# Patient Record
Sex: Male | Born: 1960 | Race: White | Hispanic: No | Marital: Single | Smoking: Never smoker
Health system: Southern US, Community
[De-identification: ages and names within clinical notes are randomized; demographics above are authoritative.]

## PROBLEM LIST (undated history)

## (undated) DIAGNOSIS — M1990 Unspecified osteoarthritis, unspecified site: Secondary | ICD-10-CM

## (undated) DIAGNOSIS — K219 Gastro-esophageal reflux disease without esophagitis: Secondary | ICD-10-CM

## (undated) DIAGNOSIS — J45909 Unspecified asthma, uncomplicated: Secondary | ICD-10-CM

## (undated) DIAGNOSIS — E785 Hyperlipidemia, unspecified: Secondary | ICD-10-CM

## (undated) DIAGNOSIS — I10 Essential (primary) hypertension: Secondary | ICD-10-CM

## (undated) HISTORY — PX: KNEE GANGLION EXCISION: SHX691

## (undated) HISTORY — PX: CYST REMOVAL LEG: SHX6280

## (undated) HISTORY — PX: JOINT REPLACEMENT: SHX530

## (undated) HISTORY — PX: INCISION AND DRAINAGE ABSCESS: SHX5864

## (undated) HISTORY — DX: Hyperlipidemia, unspecified: E78.5

---

## 2011-03-23 ENCOUNTER — Emergency Department: Payer: Self-pay | Admitting: Emergency Medicine

## 2018-01-06 ENCOUNTER — Telehealth: Payer: Self-pay

## 2018-01-06 ENCOUNTER — Other Ambulatory Visit: Payer: Self-pay

## 2018-01-06 DIAGNOSIS — Z1211 Encounter for screening for malignant neoplasm of colon: Secondary | ICD-10-CM

## 2018-01-06 NOTE — Telephone Encounter (Signed)
Gastroenterology Pre-Procedure Review  Request Date: 02/19/18 Requesting Physician: Dr. Allen Norris  PATIENT REVIEW QUESTIONS: The patient responded to the following health history questions as indicated:    1. Are you having any GI issues? no 2. Do you have a personal history of Polyps? no 3. Do you have a family history of Colon Cancer or Polyps? yes (Family history of polyps) 4. Diabetes Mellitus? no 5. Joint replacements in the past 12 months?no 6. Major health problems in the past 3 months?no 7. Any artificial heart valves, MVP, or defibrillator?no    MEDICATIONS & ALLERGIES:    Patient reports the following regarding taking any anticoagulation/antiplatelet therapy:   Plavix, Coumadin, Eliquis, Xarelto, Lovenox, Pradaxa, Brilinta, or Effient? no Aspirin? yes (81 mg)  Patient confirms/reports the following medications:  No current outpatient medications on file.   No current facility-administered medications for this visit.     Patient confirms/reports the following allergies:  Allergies not on file  No orders of the defined types were placed in this encounter.   AUTHORIZATION INFORMATION Primary Insurance: 1D#: Group #:  Secondary Insurance: 1D#: Group #:  SCHEDULE INFORMATION: Date: 02/19/18  Time: Location:[facility]

## 2018-02-15 NOTE — Discharge Instructions (Signed)
General Anesthesia, Adult, Care After °These instructions provide you with information about caring for yourself after your procedure. Your health care provider may also give you more specific instructions. Your treatment has been planned according to current medical practices, but problems sometimes occur. Call your health care provider if you have any problems or questions after your procedure. °What can I expect after the procedure? °After the procedure, it is common to have: °· Vomiting. °· A sore throat. °· Mental slowness. ° °It is common to feel: °· Nauseous. °· Cold or shivery. °· Sleepy. °· Tired. °· Sore or achy, even in parts of your body where you did not have surgery. ° °Follow these instructions at home: °For at least 24 hours after the procedure: °· Do not: °? Participate in activities where you could fall or become injured. °? Drive. °? Use heavy machinery. °? Drink alcohol. °? Take sleeping pills or medicines that cause drowsiness. °? Make important decisions or sign legal documents. °? Take care of children on your own. °· Rest. °Eating and drinking °· If you vomit, drink water, juice, or soup when you can drink without vomiting. °· Drink enough fluid to keep your urine clear or pale yellow. °· Make sure you have little or no nausea before eating solid foods. °· Follow the diet recommended by your health care provider. °General instructions °· Have a responsible adult stay with you until you are awake and alert. °· Return to your normal activities as told by your health care provider. Ask your health care provider what activities are safe for you. °· Take over-the-counter and prescription medicines only as told by your health care provider. °· If you smoke, do not smoke without supervision. °· Keep all follow-up visits as told by your health care provider. This is important. °Contact a health care provider if: °· You continue to have nausea or vomiting at home, and medicines are not helpful. °· You  cannot drink fluids or start eating again. °· You cannot urinate after 8-12 hours. °· You develop a skin rash. °· You have fever. °· You have increasing redness at the site of your procedure. °Get help right away if: °· You have difficulty breathing. °· You have chest pain. °· You have unexpected bleeding. °· You feel that you are having a life-threatening or urgent problem. °This information is not intended to replace advice given to you by your health care provider. Make sure you discuss any questions you have with your health care provider. °Document Released: 03/23/2001 Document Revised: 05/19/2016 Document Reviewed: 11/29/2015 °Elsevier Interactive Patient Education © 2018 [employer] ° °

## 2018-02-19 ENCOUNTER — Ambulatory Visit: Payer: Self-pay | Admitting: Anesthesiology

## 2018-02-19 ENCOUNTER — Ambulatory Visit
Admission: RE | Admit: 2018-02-19 | Discharge: 2018-02-19 | Disposition: A | Discharge status: Home / Self Care | Payer: Self-pay | Source: Ambulatory Visit | Attending: Gastroenterology | Admitting: Gastroenterology

## 2018-02-19 ENCOUNTER — Encounter
Admission: RE | Disposition: A | Discharge status: Home / Self Care | Payer: Self-pay | Source: Ambulatory Visit | Attending: Gastroenterology

## 2018-02-19 DIAGNOSIS — Z1211 Encounter for screening for malignant neoplasm of colon: Secondary | ICD-10-CM

## 2018-02-19 DIAGNOSIS — Z791 Long term (current) use of non-steroidal anti-inflammatories (NSAID): Secondary | ICD-10-CM | POA: Insufficient documentation

## 2018-02-19 DIAGNOSIS — K5730 Diverticulosis of large intestine without perforation or abscess without bleeding: Secondary | ICD-10-CM | POA: Insufficient documentation

## 2018-02-19 DIAGNOSIS — K640 First degree hemorrhoids: Secondary | ICD-10-CM | POA: Insufficient documentation

## 2018-02-19 DIAGNOSIS — D125 Benign neoplasm of sigmoid colon: Secondary | ICD-10-CM

## 2018-02-19 DIAGNOSIS — Z7982 Long term (current) use of aspirin: Secondary | ICD-10-CM | POA: Insufficient documentation

## 2018-02-19 DIAGNOSIS — D122 Benign neoplasm of ascending colon: Secondary | ICD-10-CM

## 2018-02-19 DIAGNOSIS — K635 Polyp of colon: Secondary | ICD-10-CM

## 2018-02-19 DIAGNOSIS — D124 Benign neoplasm of descending colon: Secondary | ICD-10-CM | POA: Insufficient documentation

## 2018-02-19 DIAGNOSIS — Z79899 Other long term (current) drug therapy: Secondary | ICD-10-CM | POA: Insufficient documentation

## 2018-02-19 HISTORY — DX: Unspecified osteoarthritis, unspecified site: M19.90

## 2018-02-19 HISTORY — PX: POLYPECTOMY: SHX5525

## 2018-02-19 HISTORY — PX: COLONOSCOPY WITH PROPOFOL: SHX5780

## 2018-02-19 HISTORY — DX: Unspecified asthma, uncomplicated: J45.909

## 2018-02-19 SURGERY — COLONOSCOPY WITH PROPOFOL
Anesthesia: General | Site: Rectum | Wound class: Contaminated

## 2018-02-19 MED ORDER — PROPOFOL 10 MG/ML IV BOLUS
INTRAVENOUS | Status: DC | PRN
  Administered 2018-02-19: 20 mg via INTRAVENOUS
  Administered 2018-02-19: 30 mg via INTRAVENOUS
  Administered 2018-02-19: 20 mg via INTRAVENOUS
  Administered 2018-02-19: 30 mg via INTRAVENOUS
  Administered 2018-02-19: 20 mg via INTRAVENOUS
  Administered 2018-02-19: 100 mg via INTRAVENOUS
  Administered 2018-02-19 (×2): 50 mg via INTRAVENOUS

## 2018-02-19 MED ORDER — LIDOCAINE HCL (CARDIAC) 20 MG/ML IV SOLN
INTRAVENOUS | Status: DC | PRN
  Administered 2018-02-19: 50 mg via INTRAVENOUS

## 2018-02-19 MED ORDER — STERILE WATER FOR IRRIGATION IR SOLN
Status: DC | PRN
  Administered 2018-02-19: .5 mL

## 2018-02-19 MED ORDER — LACTATED RINGERS IV SOLN
10.0000 mL/h | INTRAVENOUS | Status: DC
  Administered 2018-02-19: 12:00:00 via INTRAVENOUS

## 2018-02-19 SURGICAL SUPPLY — 12 items
CANISTER SUCT 1200ML W/VALVE (MISCELLANEOUS) ×4 IMPLANT
CLIP HMST 235XBRD CATH ROT (MISCELLANEOUS) IMPLANT
CLIP RESOLUTION 360 11X235 (MISCELLANEOUS)
ELECT REM PT RETURN 9FT ADLT (ELECTROSURGICAL)
ELECTRODE REM PT RTRN 9FT ADLT (ELECTROSURGICAL) IMPLANT
FORCEPS BIOP RAD 4 LRG CAP 4 (CUTTING FORCEPS) ×4 IMPLANT
GOWN CVR UNV OPN BCK APRN NK (MISCELLANEOUS) ×4 IMPLANT
GOWN ISOL THUMB LOOP REG UNIV (MISCELLANEOUS) ×4
KIT ENDO PROCEDURE OLY (KITS) ×4 IMPLANT
SNARE SHORT THROW 13M SML OVAL (MISCELLANEOUS) IMPLANT
TRAP ETRAP POLY (MISCELLANEOUS) IMPLANT
WATER STERILE IRR 250ML POUR (IV SOLUTION) ×4 IMPLANT

## 2018-02-19 NOTE — H&P (Signed)
   Lucilla Lame, MD [address] [place], [place] [address] Phone: [phone] Fax : [phone]  Primary Care Physician:  Cletis Athens, MD Primary Gastroenterologist:  Dr. Allen Norris  Pre-Procedure History & Physical: HPI:  Nathaniel Roberts is a 57 y.o. male is here for a screening colonoscopy.   Past Medical History:  Diagnosis Date  . Arthritis    ankles  . Asthma     History reviewed. No pertinent surgical history.  Prior to Admission medications   Medication Sig Start Date End Date Taking? Authorizing Provider  aspirin 81 MG chewable tablet Chew by mouth daily.   Yes [provider]  Docusate Sodium (COLACE PO) Take by mouth daily.   Yes [provider]  Garlic 3614 MG CAPS Take by mouth daily.   Yes [provider]  loratadine (CLARITIN) 10 MG tablet Take 10 mg by mouth daily.   Yes [provider]  Multiple Vitamin (MULTIVITAMIN) tablet Take 1 tablet by mouth daily.   Yes [provider]  naproxen sodium (ALEVE) 220 MG tablet Take 220 mg by mouth daily as needed.   Yes [provider]  Omega-3 Fatty Acids (FISH OIL) 1000 MG CAPS Take by mouth daily.   Yes [provider]    Allergies as of 01/06/2018  . (Not on File)    History reviewed. No pertinent family history.  Social History   Socioeconomic History  . Marital status: Married    Spouse name: Not on file  . Number of children: Not on file  . Years of education: Not on file  . Highest education level: Not on file  Social Needs  . Financial resource strain: Not on file  . Food insecurity - worry: Not on file  . Food insecurity - inability: Not on file  . Transportation needs - medical: Not on file  . Transportation needs - non-medical: Not on file  Occupational History  . Not on file  Tobacco Use  . Smoking status: Never Smoker  . Smokeless tobacco: Never Used  Substance and Sexual Activity  . Alcohol use: Yes    Alcohol/week:  7.2 oz    Types: 12 Glasses of wine per week  . Drug use: Not on file  . Sexual activity: Not on file  Other Topics Concern  . Not on file  Social History Narrative  . Not on file    Review of Systems: See HPI, otherwise negative ROS  Physical Exam: BP (!) 136/94   Pulse 74   Temp 98.1 F (36.7 C) (Temporal)   Ht 5' 6.5" (1.689 m)   Wt 198 lb (89.8 kg)   SpO2 98%   BMI 31.48 kg/m  General:   Alert,  pleasant and cooperative in NAD Head:  Normocephalic and atraumatic. Neck:  Supple; no masses or thyromegaly. Lungs:  Clear throughout to auscultation.    Heart:  Regular rate and rhythm. Abdomen:  Soft, nontender and nondistended. Normal bowel sounds, without guarding, and without rebound.   Neurologic:  Alert and  oriented x4;  grossly normal neurologically.  Impression/Plan: Nathaniel Roberts is now here to undergo a screening colonoscopy.  Risks, benefits, and alternatives regarding colonoscopy have been reviewed with the patient.  Questions have been answered.  All parties agreeable.

## 2018-02-19 NOTE — Anesthesia Preprocedure Evaluation (Signed)
Anesthesia Evaluation  Patient identified by MRN, date of birth, ID band Patient awake    Reviewed: Allergy & Precautions, H&P , NPO status , Patient's Chart, lab work & pertinent test results  Airway Mallampati: II  TM Distance: >3 FB Neck ROM: full    Dental no notable dental hx.    Pulmonary asthma ,    Pulmonary exam normal breath sounds clear to auscultation       Cardiovascular Normal cardiovascular exam Rhythm:regular Rate:Normal     Neuro/Psych    GI/Hepatic   Endo/Other    Renal/GU      Musculoskeletal   Abdominal   Peds  Hematology   Anesthesia Other Findings   Reproductive/Obstetrics                             Anesthesia Physical Anesthesia Plan  ASA: II  Anesthesia Plan: General   Post-op Pain Management:    Induction:   PONV Risk Score and Plan: 2 and Treatment may vary due to age or medical condition and Propofol infusion  Airway Management Planned:   Additional Equipment:   Intra-op Plan:   Post-operative Plan:   Informed Consent: I have reviewed the patients History and Physical, chart, labs and discussed the procedure including the risks, benefits and alternatives for the proposed anesthesia with the patient or authorized representative who has indicated his/her understanding and acceptance.     Plan Discussed with: CRNA  Anesthesia Plan Comments:         Anesthesia Quick Evaluation

## 2018-02-19 NOTE — Op Note (Signed)
[facility] Gastroenterology Patient Name: Nathaniel Roberts Procedure Date: 02/19/2018 11:58 AM MRN: [id number] Account #: [id number] Date of Birth: 19-Jan-1961 Admit Type: Outpatient Age: 57 Room: [facility] OR ROOM 01 Gender: Male Note Status: Finalized Procedure:            Colonoscopy Indications:          Screening for colorectal malignant neoplasm Providers:            Lucilla Lame MD, MD Referring MD:         Cletis Athens, MD (Referring MD) Medicines:            Propofol per Anesthesia Complications:        No immediate complications. Procedure:            Pre-Anesthesia Assessment:                       - Prior to the procedure, a History and Physical was                        performed, and patient medications and allergies were                        reviewed. The patient's tolerance of previous                        anesthesia was also reviewed. The risks and benefits of                        the procedure and the sedation options and risks were                        discussed with the patient. All questions were                        answered, and informed consent was obtained. Prior                        Anticoagulants: The patient has taken no previous                        anticoagulant or antiplatelet agents. ASA Grade                        Assessment: II - A patient with mild systemic disease.                        After reviewing the risks and benefits, the patient was                        deemed in satisfactory condition to undergo the                        procedure.                       After obtaining informed consent, the colonoscope was                        passed under direct vision. Throughout the procedure,  the patient's blood pressure, pulse, and oxygen                        saturations were monitored continuously. The Olympus CF                        H180AL Colonoscope (S#: [id number]) was introduced through                         the anus and advanced to the the cecum, identified by                        appendiceal orifice and ileocecal valve. The                        colonoscopy was performed without difficulty. The                        patient tolerated the procedure well. The quality of                        the bowel preparation was excellent. Findings:      The perianal and digital rectal examinations were normal.      A 5 mm polyp was found in the sigmoid colon. The polyp was sessile. The       polyp was removed with a cold snare. Resection and retrieval were       complete.      A 3 mm polyp was found in the ascending colon. The polyp was sessile.       The polyp was removed with a cold biopsy forceps. Resection and       retrieval were complete.      Non-bleeding internal hemorrhoids were found during retroflexion. The       hemorrhoids were Grade I (internal hemorrhoids that do not prolapse).      Multiple small-mouthed diverticula were found in the sigmoid colon. Impression:           - One 5 mm polyp in the sigmoid colon, removed with a                        cold snare. Resected and retrieved.                       - One 3 mm polyp in the ascending colon, removed with a                        cold biopsy forceps. Resected and retrieved.                       - Non-bleeding internal hemorrhoids.                       - Diverticulosis in the sigmoid colon. Recommendation:       - Discharge patient to home.                       - Resume previous diet.                       - Continue present medications.                       -  Repeat colonoscopy in 5 years for surveillance. Procedure Code(s):    --- Professional ---                       [phone], Colonoscopy, flexible; with removal of tumor(s),                        polyp(s), or other lesion(s) by snare technique                       [id number], 68, Colonoscopy, flexible; with biopsy, single                        or  multiple Diagnosis Code(s):    --- Professional ---                       Z12.11, Encounter for screening for malignant neoplasm                        of colon                       D12.5, Benign neoplasm of sigmoid colon                       D12.2, Benign neoplasm of ascending colon CPT copyright 2016 American Medical Association. All rights reserved. The codes documented in this report are preliminary and upon coder review may  be revised to meet current compliance requirements. Lucilla Lame MD, MD 02/19/2018 12:20:26 PM This report has been signed electronically. Number of Addenda: 0 Note Initiated On: 02/19/2018 11:58 AM Scope Withdrawal Time: 0 hours 8 minutes 7 seconds  Total Procedure Duration: 0 hours 11 minutes 26 seconds       [facility]

## 2018-02-19 NOTE — Anesthesia Procedure Notes (Signed)
Procedure Name: MAC Date/Time: 02/19/2018 12:01 PM Performed by: Janna Arch, CRNA Pre-anesthesia Checklist: Patient identified, Emergency Drugs available, Suction available and Patient being monitored Patient Re-evaluated:Patient Re-evaluated prior to induction Oxygen Delivery Method: Nasal cannula

## 2018-02-19 NOTE — Transfer of Care (Signed)
Immediate Anesthesia Transfer of Care Note  Patient: Nathaniel Roberts  Procedure(s) Performed: COLONOSCOPY WITH PROPOFOL (N/A Rectum) POLYPECTOMY (Rectum)  Patient Location: PACU  Anesthesia Type: General  Level of Consciousness: awake, alert  and patient cooperative  Airway and Oxygen Therapy: Patient Spontanous Breathing and Patient connected to supplemental oxygen  Post-op Assessment: Post-op Vital signs reviewed, Patient's Cardiovascular Status Stable, Respiratory Function Stable, Patent Airway and No signs of Nausea or vomiting  Post-op Vital Signs: Reviewed and stable  Complications: No apparent anesthesia complications

## 2018-02-19 NOTE — Anesthesia Postprocedure Evaluation (Signed)
Anesthesia Post Note  Patient: Nathaniel Roberts  Procedure(s) Performed: COLONOSCOPY WITH PROPOFOL (N/A Rectum) POLYPECTOMY (Rectum)  Patient location during evaluation: PACU Anesthesia Type: General Level of consciousness: awake and alert and oriented Pain management: satisfactory to patient Vital Signs Assessment: post-procedure vital signs reviewed and stable Respiratory status: spontaneous breathing, nonlabored ventilation and respiratory function stable Cardiovascular status: blood pressure returned to baseline and stable Postop Assessment: Adequate PO intake and No signs of nausea or vomiting Anesthetic complications: no    Raliegh Ip

## 2018-02-22 ENCOUNTER — Encounter: Payer: Self-pay | Admitting: Gastroenterology

## 2018-02-23 ENCOUNTER — Encounter: Payer: Self-pay | Admitting: Gastroenterology

## 2018-02-24 ENCOUNTER — Encounter: Payer: Self-pay | Admitting: Gastroenterology

## 2020-05-29 ENCOUNTER — Ambulatory Visit: Payer: Self-pay | Admitting: Orthopedic Surgery

## 2020-06-01 ENCOUNTER — Encounter: Payer: Self-pay | Admitting: Internal Medicine

## 2020-06-01 ENCOUNTER — Other Ambulatory Visit: Payer: Self-pay

## 2020-06-01 ENCOUNTER — Ambulatory Visit (INDEPENDENT_AMBULATORY_CARE_PROVIDER_SITE_OTHER): Payer: 59 | Admitting: Internal Medicine

## 2020-06-01 VITALS — BP 180/99 | HR 90 | Wt 208.1 lb

## 2020-06-01 DIAGNOSIS — M1610 Unilateral primary osteoarthritis, unspecified hip: Secondary | ICD-10-CM

## 2020-06-01 DIAGNOSIS — I10 Essential (primary) hypertension: Secondary | ICD-10-CM

## 2020-06-01 DIAGNOSIS — Z01818 Encounter for other preprocedural examination: Secondary | ICD-10-CM | POA: Diagnosis not present

## 2020-06-01 DIAGNOSIS — J4520 Mild intermittent asthma, uncomplicated: Secondary | ICD-10-CM | POA: Diagnosis not present

## 2020-06-03 ENCOUNTER — Encounter: Payer: Self-pay | Admitting: Internal Medicine

## 2020-06-03 DIAGNOSIS — I10 Essential (primary) hypertension: Secondary | ICD-10-CM | POA: Insufficient documentation

## 2020-06-03 DIAGNOSIS — J45909 Unspecified asthma, uncomplicated: Secondary | ICD-10-CM | POA: Insufficient documentation

## 2020-06-03 DIAGNOSIS — M1610 Unilateral primary osteoarthritis, unspecified hip: Secondary | ICD-10-CM | POA: Insufficient documentation

## 2020-06-03 DIAGNOSIS — Z01818 Encounter for other preprocedural examination: Secondary | ICD-10-CM | POA: Insufficient documentation

## 2020-06-03 NOTE — Assessment & Plan Note (Signed)
Seen in the office for the right hip pain and elevated blood pressure ,he wanted to have the surgery done on the right hip/ total replacement.  He had a recent MRI done which revealed collapse of the right hip.  Also has been taking a lot of nonsteroidal for the pain relief and his blood pressure was found to be elevated.  Repeat blood pressure today 180/99 I  started him on Hyzaar 100/12.5 p.o. daily.   Echocardiogram does not show any acute changes.  He will come Back next week again to get his blood pressure checked so I can clear him for surgery.

## 2020-06-03 NOTE — Progress Notes (Signed)
Established Patient Office Visit  Subjective:  Patient ID: Nathaniel Roberts, male    DOB: January 26, 1961  Age: 59 y.o. MRN: [id number]  CC:  Chief Complaint  Patient presents with  . surgical clearance    patient is having total hip replacement   . Hypertension    BP elevated     Hip Pain  There was no injury mechanism. The pain is at a severity of 6/10. The pain is severe. The pain has been constant since onset. Associated symptoms include an inability to bear weight. The symptoms are aggravated by movement, palpation and weight bearing. He has tried NSAIDs for the symptoms. The treatment provided no relief.  Hypertension    Nathaniel Roberts presents for preop clearance  Past Medical History:  Diagnosis Date  . Arthritis    ankles  . Asthma     Past Surgical History:  Procedure Laterality Date  . COLONOSCOPY WITH PROPOFOL N/A 02/19/2018   Procedure: COLONOSCOPY WITH PROPOFOL;  Surgeon: Lucilla Lame, MD;  Location: [facility];  Service: Endoscopy;  Laterality: N/A;  . POLYPECTOMY  02/19/2018   Procedure: POLYPECTOMY;  Surgeon: Lucilla Lame, MD;  Location: [facility];  Service: Endoscopy;;    History reviewed. No pertinent family history.  Social History   Socioeconomic History  . Marital status: Married    Spouse name: Not on file  . Number of children: Not on file  . Years of education: Not on file  . Highest education level: Not on file  Occupational History  . Not on file  Tobacco Use  . Smoking status: Never Smoker  . Smokeless tobacco: Never Used  Substance and Sexual Activity  . Alcohol use: Yes    Alcohol/week: 12.0 standard drinks    Types: 12 Glasses of wine per week  . Drug use: Not on file  . Sexual activity: Not on file  Other Topics Concern  . Not on file  Social History Narrative  . Not on file   Social Determinants of Health   Financial Resource Strain:   . Difficulty of Paying Living Expenses:   Food Insecurity:   . Worried  About Charity fundraiser in the Last Year:   . Arboriculturist in the Last Year:   Transportation Needs:   . Film/video editor (Medical):   Marland Kitchen Lack of Transportation (Non-Medical):   Physical Activity:   . Days of Exercise per Week:   . Minutes of Exercise per Session:   Stress:   . Feeling of Stress :   Social Connections:   . Frequency of Communication with Friends and Family:   . Frequency of Social Gatherings with Friends and Family:   . Attends Religious Services:   . Active Member of Clubs or Organizations:   . Attends Archivist Meetings:   Marland Kitchen Marital Status:   Intimate Partner Violence:   . Fear of Current or Ex-Partner:   . Emotionally Abused:   Marland Kitchen Physically Abused:   . Sexually Abused:      Current Outpatient Medications:  .  clobetasol cream (TEMOVATE) 5.36 %, Apply 1 application topically daily as needed (psoriasis)., Disp: , Rfl:  .  GLUCOSAMINE-CHONDROITIN PO, Take 2 tablets by mouth daily., Disp: , Rfl:  .  loratadine (CLARITIN) 10 MG tablet, Take 10 mg by mouth daily., Disp: , Rfl:  .  Multiple Vitamin (MULTIVITAMIN) tablet, Take 1 tablet by mouth daily., Disp: , Rfl:  .  naproxen sodium (ALEVE)  220 MG tablet, Take 220 mg by mouth in the morning. , Disp: , Rfl:    No Known Allergies  ROS Review of Systems  Constitutional: Negative.   HENT: Negative.   Eyes: Negative.   Respiratory: Negative.   Cardiovascular: Negative.   Gastrointestinal: Negative.   Endocrine: Negative.   Genitourinary: Negative.  Negative for hematuria.  Musculoskeletal:       .  Right hip pain  Neurological: Negative.   Hematological: Negative.   Psychiatric/Behavioral: Negative.       Objective:    Physical Exam  Constitutional: He appears well-developed.  HENT:  Head: Normocephalic.  Eyes: Pupils are equal, round, and reactive to light.  Neck: No JVD present. No tracheal deviation present. No thyromegaly present.  Cardiovascular: Normal rate, regular  rhythm and normal heart sounds. Exam reveals no friction rub.  Pulmonary/Chest: He has no wheezes.  Abdominal: There is no abdominal tenderness. There is no guarding.  Musculoskeletal:        General: No edema.     Cervical back: Normal range of motion.  Lymphadenopathy:    He has no cervical adenopathy.  Neurological: He is alert.  Psychiatric: He has a normal mood and affect. His behavior is normal.    BP (!) 180/99   Pulse 90   Wt 208 lb 1.6 oz (94.4 kg)   BMI 33.09 kg/m  Wt Readings from Last 3 Encounters:  06/01/20 208 lb 1.6 oz (94.4 kg)  02/19/18 198 lb (89.8 kg)     Health Maintenance Due  Topic Date Due  . Hepatitis C Screening  Never done  . COVID-19 Vaccine (1) Never done  . HIV Screening  Never done  . TETANUS/TDAP  Never done    There are no preventive care reminders to display for this patient.  No results found for: TSH No results found for: WBC, HGB, HCT, MCV, PLT No results found for: NA, K, CHLORIDE, CO2, GLUCOSE, BUN, CREATININE, BILITOT, ALKPHOS, AST, ALT, PROT, ALBUMIN, CALCIUM, ANIONGAP, EGFR, GFR No results found for: CHOL No results found for: HDL No results found for: LDLCALC No results found for: TRIG No results found for: CHOLHDL No results found for: HGBA1C    Assessment & Plan:   Problem List Items Addressed This Visit      Cardiovascular and Mediastinum   Essential hypertension   BP  Elevated  Today Ekg  dont show  Any acute changes/ non specific T wave changes     Respiratory   Asthma    stable     Musculoskeletal and Integument   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  He had a recent MRI done which revealed collapse of the right hip.  Also has been taking a lot of nonsteroidal for the pain relief and his blood pressure was  found to be elevated.  Repeat blood pressure today 180/99 I  started him on Hyzaar 100/12.5 p.o. daily.   ekg does not show any acute changes.  He will come Back next week again to get his blood pressure checked so I can clear him for surgery.      Relevant Orders   EKG 12-Lead No acute changes nonspecific T changes      No orders of the  defined types were placed in this encounter.   Follow-up: Return in about 1 year (around 06/01/2021).    Cletis Athens, MD

## 2020-06-05 ENCOUNTER — Encounter (HOSPITAL_COMMUNITY)
Admission: RE | Admit: 2020-06-05 | Discharge: 2020-06-05 | Disposition: A | Discharge status: Home / Self Care | Payer: 59 | Source: Ambulatory Visit | Attending: Orthopedic Surgery | Admitting: Orthopedic Surgery

## 2020-06-05 ENCOUNTER — Encounter (HOSPITAL_COMMUNITY): Payer: Self-pay

## 2020-06-05 ENCOUNTER — Other Ambulatory Visit: Payer: Self-pay

## 2020-06-05 DIAGNOSIS — Z01812 Encounter for preprocedural laboratory examination: Secondary | ICD-10-CM | POA: Insufficient documentation

## 2020-06-05 HISTORY — DX: Essential (primary) hypertension: I10

## 2020-06-05 LAB — COMPREHENSIVE METABOLIC PANEL
ALT: 62 U/L — ABNORMAL HIGH (ref 0–44)
AST: 28 U/L (ref 15–41)
Albumin: 4.3 g/dL (ref 3.5–5.0)
Alkaline Phosphatase: 59 U/L (ref 38–126)
Anion gap: 9 (ref 5–15)
BUN: 17 mg/dL (ref 6–20)
CO2: 26 mmol/L (ref 22–32)
Calcium: 9.1 mg/dL (ref 8.9–10.3)
Chloride: 101 mmol/L (ref 98–111)
Creatinine, Ser: 0.94 mg/dL (ref 0.61–1.24)
GFR calc Af Amer: >60 mL/min (ref >60)
GFR calc non Af Amer: >60 mL/min (ref >60)
Glucose, Bld: 122 mg/dL — ABNORMAL HIGH (ref 70–99)
Potassium: 3.7 mmol/L (ref 3.5–5.1)
Sodium: 136 mmol/L (ref 135–145)
Total Bilirubin: 0.6 mg/dL (ref 0.3–1.2)
Total Protein: 7.7 g/dL (ref 6.5–8.1)

## 2020-06-05 LAB — URINALYSIS, ROUTINE W REFLEX MICROSCOPIC
Bilirubin Urine: NEGATIVE
Glucose, UA: NEGATIVE mg/dL
Hgb urine dipstick: NEGATIVE
Ketones, ur: NEGATIVE mg/dL
Leukocytes,Ua: NEGATIVE
Nitrite: NEGATIVE
Protein, ur: NEGATIVE mg/dL
Specific Gravity, Urine: 1.014 (ref 1.005–1.030)
pH: 7 (ref 5.0–8.0)

## 2020-06-05 LAB — CBC
HCT: 45.8 % (ref 39.0–52.0)
Hemoglobin: 15.7 g/dL (ref 13.0–17.0)
MCH: 32.6 pg (ref 26.0–34.0)
MCHC: 34.3 g/dL (ref 30.0–36.0)
MCV: 95.2 fL (ref 80.0–100.0)
Platelets: 191 10*3/uL (ref 150–400)
RBC: 4.81 MIL/uL (ref 4.22–5.81)
RDW: 11.9 % (ref 11.5–15.5)
WBC: 6.4 10*3/uL (ref 4.0–10.5)
nRBC: 0 % (ref 0.0–0.2)

## 2020-06-05 LAB — PROTIME-INR
INR: 0.9 (ref 0.8–1.2)
Prothrombin Time: 11.9 seconds (ref 11.4–15.2)

## 2020-06-05 LAB — ABO/RH: ABO/RH(D): A POS

## 2020-06-05 LAB — SURGICAL PCR SCREEN
MRSA, PCR: NEGATIVE
Staphylococcus aureus: POSITIVE — AB

## 2020-06-05 NOTE — Progress Notes (Signed)
COVID Vaccine Completed:yes Date COVID Vaccine completed:04/2020 COVID vaccine manufacturer: Pfizer    Moderna   *[employer]   PCP - Dr. Cletis Athens. LOV: 06/01/20 Cardiologist -   Chest x-ray -  EKG - 06/01/20 EPIC Stress Test -  ECHO -  Cardiac Cath -   Sleep Study -  CPAP -   Fasting Blood Sugar -  Checks Blood Sugar _____ times a day  Blood Thinner Instructions: Aspirin Instructions: Last Dose:  Anesthesia review: New onset of hypertension.  Patient denies shortness of breath, fever, cough and chest pain at PAT appointment   Patient verbalized understanding of instructions that were given to them at the PAT appointment. Patient was also instructed that they will need to review over the PAT instructions again at home before surgery.

## 2020-06-05 NOTE — Patient Instructions (Signed)
DUE TO COVID-19 ONLY ONE VISITOR IS ALLOWED TO COME WITH YOU AND STAY IN THE WAITING ROOM ONLY DURING PRE OP AND PROCEDURE DAY OF SURGERY. THE 1 VISITOR MAY VISIT WITH YOU AFTER SURGERY IN YOUR PRIVATE ROOM DURING VISITING HOURS ONLY!  YOU NEED TO HAVE A COVID 19 TEST ON: 06/09/20 @ 11:00 am, THIS TEST MUST BE DONE BEFORE SURGERY, COME  [facility] [place] , [id number].  ([place]) ONCE YOUR COVID TEST IS COMPLETED, PLEASE BEGIN THE QUARANTINE INSTRUCTIONS AS OUTLINED IN YOUR HANDOUT.                [name]   Your procedure is scheduled on: 06/13/20   Report to [facility] Main  Entrance   Report to admitting at: 9:00 AM     Call this number if you have problems the morning of surgery [phone]    Remember:   NO SOLID FOOD AFTER MIDNIGHT THE NIGHT PRIOR TO SURGERY. NOTHING BY MOUTH EXCEPT CLEAR LIQUIDS UNTIL: 8:30 am . PLEASE FINISH ENSURE DRINK PER SURGEON ORDER  WHICH NEEDS TO BE COMPLETED AT : 8:30 am.   CLEAR LIQUID DIET   Foods Allowed                                                                     Foods Excluded  Coffee and tea, regular and decaf                             liquids that you cannot  Plain Jell-O any favor except red or purple                                           see through such as: Fruit ices (not with fruit pulp)                                     milk, soups, orange juice  Iced Popsicles                                    All solid food Carbonated beverages, regular and diet                                    Cranberry, grape and apple juices Sports drinks like Gatorade Lightly seasoned clear broth or consume(fat free) Sugar, honey syrup  Sample Menu Breakfast                                Lunch                                     Supper Cranberry juice  Beef broth                            Chicken broth Jell-O                                     Grape juice                            Apple juice Coffee or tea                        Jell-O                                      Popsicle                                                Coffee or tea                        Coffee or tea  _____________________________________________________________________   BRUSH YOUR TEETH MORNING OF SURGERY AND RINSE YOUR MOUTH OUT, NO CHEWING GUM CANDY OR MINTS.     Take these medicines the morning of surgery with A SIP OF WATER: Loratadine.                                 You may not have any metal on your body including hair pins and              piercings  Do not wear jewelry, lotions, powders or perfumes, deodorant             Men may shave face and neck.   Do not bring valuables to the hospital. [place].  Contacts, dentures or bridgework may not be worn into surgery.  Leave suitcase in the car. After surgery it may be brought to your room.     Patients discharged the day of surgery will not be allowed to drive home. IF YOU ARE HAVING SURGERY AND GOING HOME THE SAME DAY, YOU MUST HAVE AN ADULT TO DRIVE YOU HOME AND BE WITH YOU FOR 24 HOURS. YOU MAY GO HOME BY TAXI OR UBER OR ORTHERWISE, BUT AN ADULT MUST ACCOMPANY YOU HOME AND STAY WITH YOU FOR 24 HOURS.  Name and phone number of your driver:  Special Instructions: N/A              Please read over the following fact sheets you were given: _____________________________________________________________________  [facility] - Preparing for Surgery Before surgery, you can play an important role.  Because skin is not sterile, your skin needs to be as free of germs as possible.  You can reduce the number of germs on your skin by washing with CHG (chlorahexidine gluconate) soap before surgery.  CHG is an antiseptic cleaner which kills germs and bonds with the skin to continue killing germs even after washing. Please DO  NOT use if you have an allergy to CHG or antibacterial soaps.  If  your skin becomes reddened/irritated stop using the CHG and inform your nurse when you arrive at Short Stay. Do not shave (including legs and underarms) for at least 48 hours prior to the first CHG shower.  You may shave your face/neck. Please follow these instructions carefully:  1.  Shower with CHG Soap the night before surgery and the  morning of Surgery.  2.  If you choose to wash your hair, wash your hair first as usual with your  normal  shampoo.  3.  After you shampoo, rinse your hair and body thoroughly to remove the  shampoo.                           4.  Use CHG as you would any other liquid soap.  You can apply chg directly  to the skin and wash                       Gently with a scrungie or clean washcloth.  5.  Apply the CHG Soap to your body ONLY FROM THE NECK DOWN.   Do not use on face/ open                           Wound or open sores. Avoid contact with eyes, ears mouth and genitals (private parts).                       Wash face,  Genitals (private parts) with your normal soap.             6.  Wash thoroughly, paying special attention to the area where your surgery  will be performed.  7.  Thoroughly rinse your body with warm water from the neck down.  8.  DO NOT shower/wash with your normal soap after using and rinsing off  the CHG Soap.                9.  Pat yourself dry with a clean towel.            10.  Wear clean pajamas.            11.  Place clean sheets on your bed the night of your first shower and do not  sleep with pets. Day of Surgery : Do not apply any lotions/deodorants the morning of surgery.  Please wear clean clothes to the hospital/surgery center.  FAILURE TO FOLLOW THESE INSTRUCTIONS MAY RESULT IN THE CANCELLATION OF YOUR SURGERY PATIENT SIGNATURE_________________________________  NURSE SIGNATURE__________________________________  ________________________________________________________________________   Adam Phenix  An incentive  spirometer is a tool that can help keep your lungs clear and active. This tool measures how well you are filling your lungs with each breath. Taking long deep breaths may help reverse or decrease the chance of developing breathing (pulmonary) problems (especially infection) following:  A long period of time when you are unable to move or be active. BEFORE THE PROCEDURE   If the spirometer includes an indicator to show your best effort, your nurse or respiratory therapist will set it to a desired goal.  If possible, sit up straight or lean slightly forward. Try not to slouch.  Hold the incentive spirometer in an upright position. INSTRUCTIONS FOR USE  1. Sit on the edge of your bed if  possible, or sit up as far as you can in bed or on a chair. 2. Hold the incentive spirometer in an upright position. 3. Breathe out normally. 4. Place the mouthpiece in your mouth and seal your lips tightly around it. 5. Breathe in slowly and as deeply as possible, raising the piston or the ball toward the top of the column. 6. Hold your breath for 3-5 seconds or for as long as possible. Allow the piston or ball to fall to the bottom of the column. 7. Remove the mouthpiece from your mouth and breathe out normally. 8. Rest for a few seconds and repeat Steps 1 through 7 at least 10 times every 1-2 hours when you are awake. Take your time and take a few normal breaths between deep breaths. 9. The spirometer may include an indicator to show your best effort. Use the indicator as a goal to work toward during each repetition. 10. After each set of 10 deep breaths, practice coughing to be sure your lungs are clear. If you have an incision (the cut made at the time of surgery), support your incision when coughing by placing a pillow or rolled up towels firmly against it. Once you are able to get out of bed, walk around indoors and cough well. You may stop using the incentive spirometer when instructed by your caregiver.   RISKS AND COMPLICATIONS  Take your time so you do not get dizzy or light-headed.  If you are in pain, you may need to take or ask for pain medication before doing incentive spirometry. It is harder to take a deep breath if you are having pain. AFTER USE  Rest and breathe slowly and easily.  It can be helpful to keep track of a log of your progress. Your caregiver can provide you with a simple table to help with this. If you are using the spirometer at home, follow these instructions: [place] IF:   You are having difficultly using the spirometer.  You have trouble using the spirometer as often as instructed.  Your pain medication is not giving enough relief while using the spirometer.  You develop fever of 100.5 F (38.1 C) or higher. SEEK IMMEDIATE MEDICAL CARE IF:   You cough up bloody sputum that had not been present before.  You develop fever of 102 F (38.9 C) or greater.  You develop worsening pain at or near the incision site. MAKE SURE YOU:   Understand these instructions.  Will watch your condition.  Will get help right away if you are not doing well or get worse. Document Released: 04/27/2007 Document Revised: 03/08/2012 Document Reviewed: 06/28/2007 [facility] Patient Information 2014 [place], [place].   ________________________________________________________________________

## 2020-06-06 ENCOUNTER — Ambulatory Visit: Payer: 59 | Admitting: Internal Medicine

## 2020-06-06 ENCOUNTER — Encounter: Payer: Self-pay | Admitting: Internal Medicine

## 2020-06-06 VITALS — BP 137/79 | HR 72 | Wt 208.8 lb

## 2020-06-06 DIAGNOSIS — Z01818 Encounter for other preprocedural examination: Secondary | ICD-10-CM

## 2020-06-06 DIAGNOSIS — M1610 Unilateral primary osteoarthritis, unspecified hip: Secondary | ICD-10-CM | POA: Diagnosis not present

## 2020-06-06 DIAGNOSIS — I10 Essential (primary) hypertension: Secondary | ICD-10-CM | POA: Diagnosis not present

## 2020-06-06 NOTE — Assessment & Plan Note (Signed)
Avascular  Necrosis  Of rt hip

## 2020-06-06 NOTE — Progress Notes (Signed)
PCR: positive for STAPH 

## 2020-06-06 NOTE — Assessment & Plan Note (Signed)
Bp  Is stable

## 2020-06-06 NOTE — Progress Notes (Signed)
Patient ID: Nathaniel Roberts, male   DOB: [birth year], 59 y.o.   MRN: [id number]    Established Patient Office Visit  Subjective:  Patient ID: Nathaniel Roberts, male    DOB: 07-13-1961  Age: 59 y.o. MRN: [id number]  CC:  Chief Complaint  Patient presents with   Hypertension    1 weeks recheck of blood pressure     HPI  Nathaniel Roberts presented here on 06/01/2020 for a pre-op evaluation before proceeding with a right total hip arthroplasty on 06/13/2020. However, his blood pressure was high at his visit, so he was started on Hyzaar 100/12.5 po daily and was asked to come back today for reevaluation before his surgery. Denies chest pain and SOB. He does not smoke, drink alcohol, or chew tobacco. His mother will be helping him out at home after surgery.   Past Medical History:  Diagnosis Date   Arthritis    ankles   Asthma    Hypertension     Past Surgical History:  Procedure Laterality Date   COLONOSCOPY WITH PROPOFOL N/A 02/19/2018   Procedure: COLONOSCOPY WITH PROPOFOL;  Surgeon: Lucilla Lame, MD;  Location: [place];  Service: Endoscopy;  Laterality: N/A;   CYST REMOVAL LEG Left    KNEE GANGLION EXCISION Right    POLYPECTOMY  02/19/2018   Procedure: POLYPECTOMY;  Surgeon: Lucilla Lame, MD;  Location: [place];  Service: Endoscopy;;    History reviewed. No pertinent family history.  Social History   Socioeconomic History   Marital status: Single    Spouse name: Not on file   Number of children: Not on file   Years of education: Not on file   Highest education level: Not on file  Occupational History   Not on file  Tobacco Use   Smoking status: Never Smoker   Smokeless tobacco: Never Used  Substance and Sexual Activity   Alcohol use: Yes    Alcohol/week: 12.0 standard drinks    Types: 12 Glasses of wine per week    Comment: occas.   Drug use: Never   Sexual activity: Not on file  Other Topics Concern   Not on file  Social History  Narrative   Not on file   Social Determinants of Health   Financial Resource Strain:    Difficulty of Paying Living Expenses:   Food Insecurity:    Worried About [place] in the Last Year:    Arboriculturist in the Last Year:   Transportation Needs:    Film/video editor (Medical):    Lack of Transportation (Non-Medical):   Physical Activity:    Days of Exercise per Week:    Minutes of Exercise per Session:   Stress:    Feeling of Stress :   Social Connections:    Frequency of Communication with Friends and Family:    Frequency of Social Gatherings with Friends and Family:    Attends Religious Services:    Active Member of Clubs or Organizations:    Attends Music therapist:    Marital Status:   Intimate Partner Violence:    Fear of Current or Ex-Partner:    Emotionally Abused:    Physically Abused:    Sexually Abused:      Current Outpatient Medications:    clobetasol cream (TEMOVATE) 0.63 %, Apply 1 application topically daily as needed (psoriasis)., Disp: , Rfl:    GLUCOSAMINE-CHONDROITIN PO, Take 2 tablets by mouth daily., Disp: , Rfl:  loratadine (CLARITIN) 10 MG tablet, Take 10 mg by mouth daily., Disp: , Rfl:    losartan-hydrochlorothiazide (HYZAAR) 100-12.5 MG tablet, Take 1 tablet by mouth daily., Disp: , Rfl:    Multiple Vitamin (MULTIVITAMIN) tablet, Take 1 tablet by mouth daily., Disp: , Rfl:    naproxen sodium (ALEVE) 220 MG tablet, Take 220 mg by mouth in the morning. , Disp: , Rfl:    No Known Allergies  ROS Review of Systems  Constitutional: Negative.   HENT: Negative.   Eyes: Negative.   Respiratory: Negative.  Negative for shortness of breath.   Cardiovascular: Negative.  Negative for chest pain.  Gastrointestinal: Negative.   Endocrine: Negative.   Genitourinary: Negative.   Musculoskeletal:       Right hip pain  Skin: Negative.   Allergic/Immunologic: Negative.   Neurological: Negative.    Hematological: Negative.   Psychiatric/Behavioral: Negative.       Objective:    Physical Exam  Constitutional: The patient is oriented to person, place, and time. Pt appears well-developed and well-nourished.  Head: Normocephalic and atraumatic.  Eyes: Pupils are equal, round, and reactive to light.  Neck: No JVD present. No tracheal deviation present. No thyromegaly present.  Cardiovascular: Regular rate and rhythm. No gallop. Pulmonary/Chest: Normal breath sounds. Lungs clear to auscultation. Abdominal: No abdominal tenderness. No guarding or rebound tenderness.. Musculoskeletal: Normal range of motion.  Lymphatic: No cervical adenopathy.  Neurological: No cranial nerve deficit. Right hip pain on pressure. Skin: Skin is warm and hydrated.  Psychiatric: The patient has a normal mood and affect.  BP 137/79    Pulse 72    Wt 208 lb 12.8 oz (94.7 kg)    BMI 33.70 kg/m  Wt Readings from Last 3 Encounters:  06/06/20 208 lb 12.8 oz (94.7 kg)  06/05/20 207 lb 7 oz (94.1 kg)  06/05/20 200 lb (90.7 kg)     Health Maintenance Due  Topic Date Due   Hepatitis C Screening  Never done   COVID-19 Vaccine (1) Never done   HIV Screening  Never done   TETANUS/TDAP  Never done    There are no preventive care reminders to display for this patient.  No results found for: TSH Lab Results  Component Value Date   WBC 6.4 06/05/2020   HGB 15.7 06/05/2020   HCT 45.8 06/05/2020   MCV 95.2 06/05/2020   PLT 191 06/05/2020   Lab Results  Component Value Date   NA 136 06/05/2020   K 3.7 06/05/2020   CO2 26 06/05/2020   GLUCOSE 122 (H) 06/05/2020   BUN 17 06/05/2020   CREATININE 0.94 06/05/2020   BILITOT 0.6 06/05/2020   ALKPHOS 59 06/05/2020   AST 28 06/05/2020   ALT 62 (H) 06/05/2020   PROT 7.7 06/05/2020   ALBUMIN 4.3 06/05/2020   CALCIUM 9.1 06/05/2020   ANIONGAP 9 06/05/2020   No results found for: CHOL No results found for: HDL No results found for: LDLCALC No  results found for: TRIG No results found for: CHOLHDL No results found for: HGBA1C    Assessment & Plan:   Problem List Items Addressed This Visit      Cardiovascular and Mediastinum   Essential hypertension    Bp  Is stable      Relevant Orders   COMPLETE METABOLIC PANEL WITH GFR     Musculoskeletal and Integument   Hip arthritis    Avascular  Necrosis  Of rt hip      Relevant Orders  CBC with Differential/Platelet     Other   Preoperative clearance - Primary    Patient is approved for right hip surgery      Relevant Orders   CBC with Differential/Platelet   COMPLETE METABOLIC PANEL WITH GFR   TSH   PSA      No orders of the defined types were placed in this encounter.  1. Preoperative clearance PT is approved for surgery on the right hip. Suggest early ambulation. His blood test from 06/05/2020 reports antibody to staph so proper protocol in the hospital should be followed to address the positive test. The patient's blood sugar is 122. Suggest to follow his blood sugar during the post-operative period. His TSH and PSA have been drawn today and report is not available at this time.  - CBC with Differential/Platelet - COMPLETE METABOLIC PANEL WITH GFR - TSH - PSA  2. Essential hypertension Blood pressure is under control at this time. - COMPLETE METABOLIC PANEL WITH GFR  3. Hip arthritis Patient needs right hip replacement.  - CBC with Differential/Platelet  Follow-up: Return in about 4 weeks (around 07/04/2020).   By signing my name below, I, De Burrs, attest that this documentation has been prepared under the direction and in the presence of Cletis Athens, MD. Electronically Signed: De Burrs, Medical Scribe. 06/06/20. 10:12 AM.  I personally performed the services described in this documentation, which was SCRIBED in my presence. The recorded information has been reviewed and considered accurate. It has been edited as necessary during  review. Cletis Athens, MD

## 2020-06-06 NOTE — Assessment & Plan Note (Signed)
Patient is approved for right hip surgery

## 2020-06-07 ENCOUNTER — Ambulatory Visit: Payer: Self-pay | Admitting: Internal Medicine

## 2020-06-07 LAB — CBC WITH DIFFERENTIAL/PLATELET
Absolute Monocytes: 636 cells/uL (ref 200–950)
Basophils Absolute: 42 cells/uL (ref 0–200)
Basophils Relative: 0.8 %
Eosinophils Absolute: 244 cells/uL (ref 15–500)
Eosinophils Relative: 4.6 %
HCT: 46.1 % (ref 38.5–50.0)
Hemoglobin: 15.8 g/dL (ref 13.2–17.1)
Lymphs Abs: 1818 cells/uL (ref 850–3900)
MCH: 32.7 pg (ref 27.0–33.0)
MCHC: 34.3 g/dL (ref 32.0–36.0)
MCV: 95.4 fL (ref 80.0–100.0)
MPV: 11.5 fL (ref 7.5–12.5)
Monocytes Relative: 12 %
Neutro Abs: 2560 cells/uL (ref 1500–7800)
Neutrophils Relative %: 48.3 %
Platelets: 158 10*3/uL (ref 140–400)
RBC: 4.83 10*6/uL (ref 4.20–5.80)
RDW: 12.8 % (ref 11.0–15.0)
Total Lymphocyte: 34.3 %
WBC: 5.3 10*3/uL (ref 3.8–10.8)

## 2020-06-07 LAB — COMPLETE METABOLIC PANEL WITH GFR
AG Ratio: 1.7 (calc) (ref 1.0–2.5)
ALT: 48 U/L — ABNORMAL HIGH (ref 9–46)
AST: 19 U/L (ref 10–35)
Albumin: 4.4 g/dL (ref 3.6–5.1)
Alkaline phosphatase (APISO): 55 U/L (ref 35–144)
BUN: 24 mg/dL (ref 7–25)
CO2: 15 mmol/L — ABNORMAL LOW (ref 20–32)
Calcium: 9.2 mg/dL (ref 8.6–10.3)
Chloride: 106 mmol/L (ref 98–110)
Creat: 0.95 mg/dL (ref 0.70–1.33)
GFR, Est African American: 101 mL/min/{1.73_m2} (ref >=60)
GFR, Est Non African American: 87 mL/min/{1.73_m2} (ref >=60)
Globulin: 2.6 g/dL (calc) (ref 1.9–3.7)
Glucose, Bld: 84 mg/dL (ref 65–99)
Potassium: 4.7 mmol/L (ref 3.5–5.3)
Sodium: 138 mmol/L (ref 135–146)
Total Bilirubin: 0.4 mg/dL (ref 0.2–1.2)
Total Protein: 7 g/dL (ref 6.1–8.1)

## 2020-06-07 LAB — PSA: PSA: 0.5 ng/mL (ref <=4.0)

## 2020-06-07 LAB — TSH: TSH: 2.34 mIU/L (ref 0.40–4.50)

## 2020-06-07 NOTE — Anesthesia Preprocedure Evaluation (Addendum)
Anesthesia Evaluation  Patient identified by MRN, date of birth, ID band Patient awake    Reviewed: Allergy & Precautions, NPO status , Patient's Chart, lab work & pertinent test results  Airway Mallampati: II  TM Distance: >3 FB Neck ROM: Full    Dental no notable dental hx. (+) Teeth Intact, Chipped,    Pulmonary neg pulmonary ROS, asthma ,    Pulmonary exam normal breath sounds clear to auscultation       Cardiovascular hypertension, Pt. on medications negative cardio ROS Normal cardiovascular exam Rhythm:Regular Rate:Normal     Neuro/Psych negative neurological ROS  negative psych ROS   GI/Hepatic negative GI ROS, Neg liver ROS,   Endo/Other  negative endocrine ROS  Renal/GU negative Renal ROS  negative genitourinary   Musculoskeletal negative musculoskeletal ROS (+) Arthritis ,   Abdominal   Peds negative pediatric ROS (+)  Hematology negative hematology ROS (+)   Anesthesia Other Findings   Reproductive/Obstetrics negative OB ROS                                                            Anesthesia Evaluation  Patient identified by MRN, date of birth, ID band Patient awake    Reviewed: Allergy & Precautions, H&P , NPO status , Patient's Chart, lab work & pertinent test results  Airway Mallampati: II  TM Distance: >3 FB Neck ROM: full    Dental no notable dental hx.    Pulmonary asthma ,    Pulmonary exam normal breath sounds clear to auscultation       Cardiovascular Normal cardiovascular exam Rhythm:regular Rate:Normal     Neuro/Psych    GI/Hepatic   Endo/Other    Renal/GU      Musculoskeletal   Abdominal   Peds  Hematology   Anesthesia Other Findings   Reproductive/Obstetrics                             Anesthesia Physical Anesthesia Plan  ASA: II  Anesthesia Plan: General   Post-op Pain Management:     Induction:   PONV Risk Score and Plan: 2 and Treatment may vary due to age or medical condition and Propofol infusion  Airway Management Planned:   Additional Equipment:   Intra-op Plan:   Post-operative Plan:   Informed Consent: I have reviewed the patients History and Physical, chart, labs and discussed the procedure including the risks, benefits and alternatives for the proposed anesthesia with the patient or authorized representative who has indicated his/her understanding and acceptance.     Plan Discussed with: CRNA  Anesthesia Plan Comments:         Anesthesia Quick Evaluation  Anesthesia Physical Anesthesia Plan  ASA: II  Anesthesia Plan: Spinal and MAC   Post-op Pain Management:    Induction:   PONV Risk Score and Plan: Propofol infusion  Airway Management Planned: Nasal Cannula and Simple Face Mask  Additional Equipment:   Intra-op Plan:   Post-operative Plan:   Informed Consent:   Plan Discussed with: Anesthesiologist and CRNA  Anesthesia Plan Comments: (Per PCP, "PT is approved for surgery on the right hip. Suggest early ambulation. His blood test from 06/05/2020 reports antibody to staph so proper protocol in the hospital should be followed to address  the positive test. The patient's blood sugar is 122. Suggest to follow his blood sugar during the post-operative period.")      Anesthesia Quick Evaluation

## 2020-06-09 ENCOUNTER — Other Ambulatory Visit (HOSPITAL_COMMUNITY)
Admission: RE | Admit: 2020-06-09 | Discharge: 2020-06-09 | Disposition: A | Discharge status: Home / Self Care | Payer: 59 | Source: Ambulatory Visit | Attending: Orthopedic Surgery | Admitting: Orthopedic Surgery

## 2020-06-09 DIAGNOSIS — Z01812 Encounter for preprocedural laboratory examination: Secondary | ICD-10-CM | POA: Diagnosis present

## 2020-06-09 DIAGNOSIS — Z20822 Contact with and (suspected) exposure to covid-19: Secondary | ICD-10-CM | POA: Insufficient documentation

## 2020-06-09 LAB — SARS CORONAVIRUS 2 (TAT 6-24 HRS): SARS Coronavirus 2: NEGATIVE

## 2020-06-11 ENCOUNTER — Ambulatory Visit: Payer: Self-pay | Admitting: Orthopedic Surgery

## 2020-06-11 NOTE — H&P (View-Only) (Signed)
TOTAL HIP ADMISSION H&P  Patient is admitted for right total hip arthroplasty.  Subjective:  Chief Complaint: right hip pain  HPI: Nathaniel Roberts, 59 y.o. 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  Onset of symptoms was gradual starting 4 years ago with gradually worsening course since that time.The patient noted no past surgery on the right hip(s).  Patient currently rates pain in the right hip at 9 out of 10 with activity. Patient has worsening of pain with activity and weight bearing, pain that interfers with activities of daily living and pain with passive range of motion. Patient has evidence of avascular necrosis with collapse by imaging studies. This condition presents safety issues increasing the risk of falls. There is no current active infection.  Patient Active Problem List   Diagnosis Date Noted  . Preoperative clearance 06/03/2020  . Hip arthritis 06/03/2020  . Essential hypertension 06/03/2020  . Asthma   . Special screening for malignant neoplasms, colon   . Polyp of sigmoid colon   . Benign neoplasm of ascending colon    Past Medical History:  Diagnosis Date  . Arthritis    ankles  . Asthma   . Hypertension     Past Surgical History:  Procedure Laterality Date  . COLONOSCOPY WITH PROPOFOL N/A 02/19/2018   Procedure: COLONOSCOPY WITH PROPOFOL;  Surgeon: Lucilla Lame, MD;  Location: [facility];  Service: Endoscopy;  Laterality: N/A;  . CYST REMOVAL LEG Left   . KNEE GANGLION EXCISION Right   . POLYPECTOMY  02/19/2018   Procedure: POLYPECTOMY;  Surgeon: Lucilla Lame, MD;  Location: [facility];  Service: Endoscopy;;    Current Outpatient Medications  Medication Sig Dispense Refill Last Dose  . clobetasol cream (TEMOVATE) 1.61 % Apply 1 application  topically daily as needed (psoriasis).     Marland Kitchen GLUCOSAMINE-CHONDROITIN PO Take 2 tablets by mouth daily.     Marland Kitchen loratadine (CLARITIN) 10 MG tablet Take 10 mg by mouth daily.     Marland Kitchen losartan-hydrochlorothiazide (HYZAAR) 100-12.5 MG tablet Take 1 tablet by mouth daily.     . Multiple Vitamin (MULTIVITAMIN) tablet Take 1 tablet by mouth daily.     . naproxen sodium (ALEVE) 220 MG tablet Take 220 mg by mouth in the morning.       No current facility-administered medications for this visit.   No Known Allergies  Social History   Tobacco Use  . Smoking status: Never Smoker  . Smokeless tobacco: Never Used  Substance Use Topics  . Alcohol use: Yes    Alcohol/week: 12.0 standard drinks    Types: 12 Glasses of wine per week    Comment: occas.    No family history on file.   Review of Systems  Constitutional: Negative for diaphoresis and fever.  HENT: Negative for congestion, facial swelling, sinus pressure and sore throat.   Eyes: Negative for visual disturbance.  Respiratory: Negative for shortness of breath.   Cardiovascular: Negative for chest pain.  Gastrointestinal: Negative for abdominal pain.  Endocrine: Negative for polydipsia, polyphagia and polyuria.  Genitourinary: Negative for difficulty urinating.  Musculoskeletal: Positive for arthralgias.  Skin: Negative.   Allergic/Immunologic: Negative.   Neurological: Negative for dizziness and headaches.  Hematological: Does not bruise/bleed easily.  Psychiatric/Behavioral: Negative for agitation and confusion.    Objective:  Physical Exam  Constitutional: He is oriented to person, place, and time.  HENT:  Head: Normocephalic and atraumatic.  Eyes: Pupils are equal, round, and reactive to light.  Cardiovascular: Normal heart sounds. Exam reveals no gallop and no friction rub.  No murmur heard. Respiratory: Breath sounds normal. He has no wheezes. He has no rhonchi. He has no rales.  GI: Soft. Normal appearance. There is no  abdominal tenderness.  Genitourinary:    Genitourinary Comments: Deferred   Musculoskeletal:     Cervical back: Neck supple.     Right hip: Bony tenderness present. Decreased range of motion.       Legs:  Neurological: He is alert and oriented to person, place, and time.  Skin: Skin is warm and dry.  Psychiatric: His behavior is normal. Mood normal.    Vital signs in last 24 hours: @VSRANGES @  Labs:   Estimated body mass index is 33.7 kg/m as calculated from the following:   Height as of 06/05/20: 5\' 6"  (1.676 m).   Weight as of 06/06/20: 94.7 kg.   Imaging Review Plain radiographs demonstrate severe degenerative joint disease of the right hip(s). The bone quality appears to be adequate for age and reported activity level.      Assessment/Plan:  End stage arthritis, right hip(s)  The patient history, physical examination, clinical judgement of the provider and imaging studies are consistent with end stage degenerative joint disease of the right hip(s) and total hip arthroplasty is deemed medically necessary. The treatment options including medical management, injection therapy, arthroscopy and arthroplasty were discussed at length. The risks and benefits of total hip arthroplasty were presented and reviewed. The risks due to aseptic loosening, infection, stiffness, dislocation/subluxation,  thromboembolic complications and other imponderables were discussed.  The patient acknowledged the explanation, agreed to proceed with the plan and consent was signed. Patient is being admitted for inpatient treatment for surgery, pain control, PT, OT, prophylactic antibiotics, VTE prophylaxis, progressive ambulation and ADL's and discharge planning.The patient is planning to be discharged home with his mother    Patient's anticipated LOS is less than 2 midnights, meeting these requirements: - Younger than 31 - Lives within 1 hour of care - Has a competent adult at home to recover with  post-op recover - NO history of  - Chronic pain requiring opiods  - Diabetes  - Coronary Artery Disease  - Heart failure  - Heart attack  - Stroke  - DVT/VTE  - Cardiac arrhythmia  - Respiratory Failure/COPD  - Renal failure  - Anemia  - Advanced Liver disease

## 2020-06-11 NOTE — H&P (Signed)
TOTAL HIP ADMISSION H&P  Patient is admitted for right total hip arthroplasty.  Subjective:  Chief Complaint: right hip pain  HPI: Nathaniel Roberts, 59 y.o. 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  Onset of symptoms was gradual starting 4 years ago with gradually worsening course since that time.The patient noted no past surgery on the right hip(s).  Patient currently rates pain in the right hip at 9 out of 10 with activity. Patient has worsening of pain with activity and weight bearing, pain that interfers with activities of daily living and pain with passive range of motion. Patient has evidence of avascular necrosis with collapse by imaging studies. This condition presents safety issues increasing the risk of falls. There is no current active infection.  Patient Active Problem List   Diagnosis Date Noted  . Preoperative clearance 06/03/2020  . Hip arthritis 06/03/2020  . Essential hypertension 06/03/2020  . Asthma   . Special screening for malignant neoplasms, colon   . Polyp of sigmoid colon   . Benign neoplasm of ascending colon    Past Medical History:  Diagnosis Date  . Arthritis    ankles  . Asthma   . Hypertension     Past Surgical History:  Procedure Laterality Date  . COLONOSCOPY WITH PROPOFOL N/A 02/19/2018   Procedure: COLONOSCOPY WITH PROPOFOL;  Surgeon: Lucilla Lame, MD;  Location: [facility];  Service: Endoscopy;  Laterality: N/A;  . CYST REMOVAL LEG Left   . KNEE GANGLION EXCISION Right   . POLYPECTOMY  02/19/2018   Procedure: POLYPECTOMY;  Surgeon: Lucilla Lame, MD;  Location: [facility];  Service: Endoscopy;;    Current Outpatient Medications  Medication Sig Dispense Refill Last Dose  . clobetasol cream (TEMOVATE) 8.85 % Apply 1 application  topically daily as needed (psoriasis).     Marland Kitchen GLUCOSAMINE-CHONDROITIN PO Take 2 tablets by mouth daily.     Marland Kitchen loratadine (CLARITIN) 10 MG tablet Take 10 mg by mouth daily.     Marland Kitchen losartan-hydrochlorothiazide (HYZAAR) 100-12.5 MG tablet Take 1 tablet by mouth daily.     . Multiple Vitamin (MULTIVITAMIN) tablet Take 1 tablet by mouth daily.     . naproxen sodium (ALEVE) 220 MG tablet Take 220 mg by mouth in the morning.       No current facility-administered medications for this visit.   No Known Allergies  Social History   Tobacco Use  . Smoking status: Never Smoker  . Smokeless tobacco: Never Used  Substance Use Topics  . Alcohol use: Yes    Alcohol/week: 12.0 standard drinks    Types: 12 Glasses of wine per week    Comment: occas.    No family history on file.   Review of Systems  Constitutional: Negative for diaphoresis and fever.  HENT: Negative for congestion, facial swelling, sinus pressure and sore throat.   Eyes: Negative for visual disturbance.  Respiratory: Negative for shortness of breath.   Cardiovascular: Negative for chest pain.  Gastrointestinal: Negative for abdominal pain.  Endocrine: Negative for polydipsia, polyphagia and polyuria.  Genitourinary: Negative for difficulty urinating.  Musculoskeletal: Positive for arthralgias.  Skin: Negative.   Allergic/Immunologic: Negative.   Neurological: Negative for dizziness and headaches.  Hematological: Does not bruise/bleed easily.  Psychiatric/Behavioral: Negative for agitation and confusion.    Objective:  Physical Exam  Constitutional: He is oriented to person, place, and time.  HENT:  Head: Normocephalic and atraumatic.  Eyes: Pupils are equal, round, and reactive to light.  Cardiovascular: Normal heart sounds. Exam reveals no gallop and no friction rub.  No murmur heard. Respiratory: Breath sounds normal. He has no wheezes. He has no rhonchi. He has no rales.  GI: Soft. Normal appearance. There is no  abdominal tenderness.  Genitourinary:    Genitourinary Comments: Deferred   Musculoskeletal:     Cervical back: Neck supple.     Right hip: Bony tenderness present. Decreased range of motion.       Legs:  Neurological: He is alert and oriented to person, place, and time.  Skin: Skin is warm and dry.  Psychiatric: His behavior is normal. Mood normal.    Vital signs in last 24 hours: @VSRANGES @  Labs:   Estimated body mass index is 33.7 kg/m as calculated from the following:   Height as of 06/05/20: 5\' 6"  (1.676 m).   Weight as of 06/06/20: 94.7 kg.   Imaging Review Plain radiographs demonstrate severe degenerative joint disease of the right hip(s). The bone quality appears to be adequate for age and reported activity level.      Assessment/Plan:  End stage arthritis, right hip(s)  The patient history, physical examination, clinical judgement of the provider and imaging studies are consistent with end stage degenerative joint disease of the right hip(s) and total hip arthroplasty is deemed medically necessary. The treatment options including medical management, injection therapy, arthroscopy and arthroplasty were discussed at length. The risks and benefits of total hip arthroplasty were presented and reviewed. The risks due to aseptic loosening, infection, stiffness, dislocation/subluxation,  thromboembolic complications and other imponderables were discussed.  The patient acknowledged the explanation, agreed to proceed with the plan and consent was signed. Patient is being admitted for inpatient treatment for surgery, pain control, PT, OT, prophylactic antibiotics, VTE prophylaxis, progressive ambulation and ADL's and discharge planning.The patient is planning to be discharged home with his mother    Patient's anticipated LOS is less than 2 midnights, meeting these requirements: - Younger than 7 - Lives within 1 hour of care - Has a competent adult at home to recover with  post-op recover - NO history of  - Chronic pain requiring opiods  - Diabetes  - Coronary Artery Disease  - Heart failure  - Heart attack  - Stroke  - DVT/VTE  - Cardiac arrhythmia  - Respiratory Failure/COPD  - Renal failure  - Anemia  - Advanced Liver disease

## 2020-06-13 ENCOUNTER — Ambulatory Visit (HOSPITAL_COMMUNITY): Payer: 59

## 2020-06-13 ENCOUNTER — Encounter (HOSPITAL_COMMUNITY): Payer: Self-pay | Admitting: Orthopedic Surgery

## 2020-06-13 ENCOUNTER — Other Ambulatory Visit: Payer: Self-pay

## 2020-06-13 ENCOUNTER — Ambulatory Visit (HOSPITAL_COMMUNITY): Payer: 59 | Admitting: Physician Assistant

## 2020-06-13 ENCOUNTER — Ambulatory Visit (HOSPITAL_COMMUNITY): Payer: 59 | Admitting: Registered Nurse

## 2020-06-13 ENCOUNTER — Ambulatory Visit (HOSPITAL_COMMUNITY)
Admission: RE | Admit: 2020-06-13 | Discharge: 2020-06-13 | Disposition: A | Discharge status: Home / Self Care | Payer: 59 | Attending: Orthopedic Surgery | Admitting: Orthopedic Surgery

## 2020-06-13 ENCOUNTER — Encounter (HOSPITAL_COMMUNITY)
Admission: RE | Disposition: A | Discharge status: Home / Self Care | Payer: Self-pay | Source: Home / Self Care | Attending: Orthopedic Surgery

## 2020-06-13 DIAGNOSIS — Z09 Encounter for follow-up examination after completed treatment for conditions other than malignant neoplasm: Secondary | ICD-10-CM

## 2020-06-13 DIAGNOSIS — M1611 Unilateral primary osteoarthritis, right hip: Secondary | ICD-10-CM | POA: Diagnosis not present

## 2020-06-13 DIAGNOSIS — I10 Essential (primary) hypertension: Secondary | ICD-10-CM | POA: Insufficient documentation

## 2020-06-13 DIAGNOSIS — M879 Osteonecrosis, unspecified: Secondary | ICD-10-CM | POA: Insufficient documentation

## 2020-06-13 DIAGNOSIS — J45909 Unspecified asthma, uncomplicated: Secondary | ICD-10-CM | POA: Diagnosis not present

## 2020-06-13 DIAGNOSIS — Z79899 Other long term (current) drug therapy: Secondary | ICD-10-CM | POA: Insufficient documentation

## 2020-06-13 DIAGNOSIS — Z419 Encounter for procedure for purposes other than remedying health state, unspecified: Secondary | ICD-10-CM

## 2020-06-13 DIAGNOSIS — Z791 Long term (current) use of non-steroidal anti-inflammatories (NSAID): Secondary | ICD-10-CM | POA: Insufficient documentation

## 2020-06-13 HISTORY — PX: TOTAL HIP ARTHROPLASTY: SHX124

## 2020-06-13 LAB — TYPE AND SCREEN
ABO/RH(D): A POS
Antibody Screen: NEGATIVE

## 2020-06-13 SURGERY — ARTHROPLASTY, HIP, TOTAL, ANTERIOR APPROACH
Anesthesia: Monitor Anesthesia Care | Site: Hip | Laterality: Right

## 2020-06-13 MED ORDER — MIDAZOLAM HCL 2 MG/2ML IJ SOLN
INTRAMUSCULAR | Status: AC
  Filled 2020-06-13: qty 2

## 2020-06-13 MED ORDER — LACTATED RINGERS IV BOLUS
500.0000 mL | Freq: Once | INTRAVENOUS | Status: AC
  Administered 2020-06-13: 500 mL via INTRAVENOUS

## 2020-06-13 MED ORDER — OXYCODONE HCL 5 MG PO TABS: 5.0000 mg | ORAL_TABLET | Freq: Once | ORAL | Status: DC | PRN

## 2020-06-13 MED ORDER — FENTANYL CITRATE (PF) 100 MCG/2ML IJ SOLN
INTRAMUSCULAR | Status: DC | PRN
  Administered 2020-06-13: 100 ug via INTRAVENOUS

## 2020-06-13 MED ORDER — PHENYLEPHRINE 40 MCG/ML (10ML) SYRINGE FOR IV PUSH (FOR BLOOD PRESSURE SUPPORT)
PREFILLED_SYRINGE | INTRAVENOUS | Status: DC | PRN
  Administered 2020-06-13 (×2): 80 ug via INTRAVENOUS

## 2020-06-13 MED ORDER — EPHEDRINE SULFATE-NACL 50-0.9 MG/10ML-% IV SOSY
PREFILLED_SYRINGE | INTRAVENOUS | Status: DC | PRN
  Administered 2020-06-13: 20 mg via INTRAVENOUS

## 2020-06-13 MED ORDER — DEXAMETHASONE SODIUM PHOSPHATE 10 MG/ML IJ SOLN
INTRAMUSCULAR | Status: DC | PRN
  Administered 2020-06-13: 10 mg via INTRAVENOUS

## 2020-06-13 MED ORDER — SENNA 8.6 MG PO TABS
2.0000 | ORAL_TABLET | Freq: Every day | ORAL | 1 refills | Status: DC
Start: 2020-06-13 — End: 2020-07-05

## 2020-06-13 MED ORDER — IRRISEPT - 450ML BOTTLE WITH 0.05% CHG IN STERILE WATER, USP 99.95% OPTIME
TOPICAL | Status: DC | PRN
  Administered 2020-06-13: 450 mL

## 2020-06-13 MED ORDER — BUPIVACAINE HCL (PF) 0.25 % IJ SOLN
INTRAMUSCULAR | Status: DC | PRN
  Administered 2020-06-13: 30 mL

## 2020-06-13 MED ORDER — LACTATED RINGERS IV SOLN: INTRAVENOUS | Status: DC

## 2020-06-13 MED ORDER — FENTANYL CITRATE (PF) 100 MCG/2ML IJ SOLN
INTRAMUSCULAR | Status: AC
  Filled 2020-06-13: qty 2

## 2020-06-13 MED ORDER — BUPIVACAINE IN DEXTROSE 0.75-8.25 % IT SOLN
INTRATHECAL | Status: DC | PRN
Start: 2020-06-13 — End: 2020-06-13
  Administered 2020-06-13: 2 mL via INTRATHECAL

## 2020-06-13 MED ORDER — ONDANSETRON HCL 4 MG/2ML IJ SOLN: 4.0000 mg | Freq: Four times a day (QID) | INTRAMUSCULAR | Status: DC | PRN

## 2020-06-13 MED ORDER — KETOROLAC TROMETHAMINE 30 MG/ML IJ SOLN
INTRAMUSCULAR | Status: AC
  Filled 2020-06-13: qty 1

## 2020-06-13 MED ORDER — TRANEXAMIC ACID-NACL 1000-0.7 MG/100ML-% IV SOLN
1000.0000 mg | INTRAVENOUS | Status: AC
  Administered 2020-06-13: 1000 mg via INTRAVENOUS
  Filled 2020-06-13: qty 100

## 2020-06-13 MED ORDER — PROPOFOL 10 MG/ML IV BOLUS
INTRAVENOUS | Status: DC | PRN
  Administered 2020-06-13: 30 mg via INTRAVENOUS
  Administered 2020-06-13 (×3): 20 mg via INTRAVENOUS

## 2020-06-13 MED ORDER — DOCUSATE SODIUM 100 MG PO CAPS
100.0000 mg | ORAL_CAPSULE | Freq: Two times a day (BID) | ORAL | 1 refills | Status: DC
Start: 2020-06-13 — End: 2020-07-05

## 2020-06-13 MED ORDER — PROPOFOL 500 MG/50ML IV EMUL
INTRAVENOUS | Status: AC
  Filled 2020-06-13: qty 50

## 2020-06-13 MED ORDER — FENTANYL CITRATE (PF) 100 MCG/2ML IJ SOLN: 25.0000 ug | INTRAMUSCULAR | Status: DC | PRN

## 2020-06-13 MED ORDER — ONDANSETRON HCL 4 MG/2ML IJ SOLN
INTRAMUSCULAR | Status: AC
  Filled 2020-06-13: qty 2

## 2020-06-13 MED ORDER — POVIDONE-IODINE 10 % EX SWAB
2.0000 "application " | Freq: Once | CUTANEOUS | Status: AC
  Administered 2020-06-13: 2 via TOPICAL

## 2020-06-13 MED ORDER — LACTATED RINGERS IV BOLUS
250.0000 mL | Freq: Once | INTRAVENOUS | Status: AC
  Administered 2020-06-13: 250 mL via INTRAVENOUS

## 2020-06-13 MED ORDER — PROPOFOL 1000 MG/100ML IV EMUL
INTRAVENOUS | Status: AC
  Filled 2020-06-13: qty 100

## 2020-06-13 MED ORDER — ACETAMINOPHEN 160 MG/5ML PO SOLN: 325.0000 mg | ORAL | Status: DC | PRN

## 2020-06-13 MED ORDER — MEPERIDINE HCL 50 MG/ML IJ SOLN: 6.2500 mg | INTRAMUSCULAR | Status: DC | PRN

## 2020-06-13 MED ORDER — SODIUM CHLORIDE (PF) 0.9 % IJ SOLN
INTRAMUSCULAR | Status: AC
  Filled 2020-06-13: qty 50

## 2020-06-13 MED ORDER — ORAL CARE MOUTH RINSE: 15.0000 mL | Freq: Once | OROMUCOSAL | Status: AC

## 2020-06-13 MED ORDER — BUPIVACAINE HCL (PF) 0.25 % IJ SOLN
INTRAMUSCULAR | Status: AC
  Filled 2020-06-13: qty 30

## 2020-06-13 MED ORDER — CHLORHEXIDINE GLUCONATE 0.12 % MT SOLN
15.0000 mL | Freq: Once | OROMUCOSAL | Status: AC
  Administered 2020-06-13: 15 mL via OROMUCOSAL

## 2020-06-13 MED ORDER — ASPIRIN 81 MG PO CHEW
81.0000 mg | CHEWABLE_TABLET | Freq: Two times a day (BID) | ORAL | 0 refills | Status: AC
Start: 2020-06-13 — End: 2020-07-28

## 2020-06-13 MED ORDER — METOCLOPRAMIDE HCL 5 MG/ML IJ SOLN: 5.0000 mg | Freq: Three times a day (TID) | INTRAMUSCULAR | Status: DC | PRN

## 2020-06-13 MED ORDER — METOCLOPRAMIDE HCL 5 MG PO TABS
5.0000 mg | ORAL_TABLET | Freq: Three times a day (TID) | ORAL | Status: DC | PRN
  Filled 2020-06-13: qty 2

## 2020-06-13 MED ORDER — OXYCODONE HCL 5 MG/5ML PO SOLN: 5.0000 mg | Freq: Once | ORAL | Status: DC | PRN

## 2020-06-13 MED ORDER — ACETAMINOPHEN 10 MG/ML IV SOLN
1000.0000 mg | INTRAVENOUS | Status: AC
  Administered 2020-06-13: 1000 mg via INTRAVENOUS
  Filled 2020-06-13: qty 100

## 2020-06-13 MED ORDER — HYDROCODONE-ACETAMINOPHEN 5-325 MG PO TABS: 1.0000 | ORAL_TABLET | ORAL | 0 refills | Status: DC | PRN

## 2020-06-13 MED ORDER — SODIUM CHLORIDE 0.9 % IV SOLN: INTRAVENOUS | Status: DC

## 2020-06-13 MED ORDER — POVIDONE-IODINE 10 % EX SWAB: 2.0000 "application " | Freq: Once | CUTANEOUS | Status: DC

## 2020-06-13 MED ORDER — DEXAMETHASONE SODIUM PHOSPHATE 10 MG/ML IJ SOLN
INTRAMUSCULAR | Status: AC
  Filled 2020-06-13: qty 1

## 2020-06-13 MED ORDER — SODIUM CHLORIDE 0.9 % IR SOLN
Status: DC | PRN
  Administered 2020-06-13: 1000 mL

## 2020-06-13 MED ORDER — ONDANSETRON HCL 4 MG PO TABS
4.0000 mg | ORAL_TABLET | Freq: Four times a day (QID) | ORAL | Status: DC | PRN
  Filled 2020-06-13: qty 1

## 2020-06-13 MED ORDER — LIDOCAINE 2% (20 MG/ML) 5 ML SYRINGE
INTRAMUSCULAR | Status: DC | PRN
  Administered 2020-06-13: 60 mg via INTRAVENOUS

## 2020-06-13 MED ORDER — ONDANSETRON HCL 4 MG PO TABS: 4.0000 mg | ORAL_TABLET | Freq: Three times a day (TID) | ORAL | 0 refills | Status: DC | PRN

## 2020-06-13 MED ORDER — MIDAZOLAM HCL 5 MG/5ML IJ SOLN
INTRAMUSCULAR | Status: DC | PRN
  Administered 2020-06-13: 2 mg via INTRAVENOUS

## 2020-06-13 MED ORDER — SODIUM CHLORIDE (PF) 0.9 % IJ SOLN
INTRAMUSCULAR | Status: DC | PRN
  Administered 2020-06-13: 30 mL

## 2020-06-13 MED ORDER — CEFAZOLIN SODIUM-DEXTROSE 2-4 GM/100ML-% IV SOLN
2.0000 g | INTRAVENOUS | Status: AC
  Administered 2020-06-13: 2 g via INTRAVENOUS
  Filled 2020-06-13: qty 100

## 2020-06-13 MED ORDER — PROPOFOL 500 MG/50ML IV EMUL
INTRAVENOUS | Status: DC | PRN
  Administered 2020-06-13: 75 ug/kg/min via INTRAVENOUS

## 2020-06-13 MED ORDER — SODIUM CHLORIDE 0.9 % IR SOLN
Status: DC | PRN
  Administered 2020-06-13: 4000 mL

## 2020-06-13 MED ORDER — PROPOFOL 10 MG/ML IV BOLUS
INTRAVENOUS | Status: AC
  Filled 2020-06-13: qty 20

## 2020-06-13 MED ORDER — LIDOCAINE 2% (20 MG/ML) 5 ML SYRINGE
INTRAMUSCULAR | Status: AC
  Filled 2020-06-13: qty 5

## 2020-06-13 MED ORDER — CEFAZOLIN SODIUM-DEXTROSE 2-4 GM/100ML-% IV SOLN: 2.0000 g | Freq: Four times a day (QID) | INTRAVENOUS | Status: DC

## 2020-06-13 MED ORDER — PHENYLEPHRINE 40 MCG/ML (10ML) SYRINGE FOR IV PUSH (FOR BLOOD PRESSURE SUPPORT)
PREFILLED_SYRINGE | INTRAVENOUS | Status: AC
  Filled 2020-06-13: qty 10

## 2020-06-13 MED ORDER — EPHEDRINE 5 MG/ML INJ
INTRAVENOUS | Status: AC
  Filled 2020-06-13: qty 10

## 2020-06-13 MED ORDER — ONDANSETRON HCL 4 MG/2ML IJ SOLN: 4.0000 mg | Freq: Once | INTRAMUSCULAR | Status: DC | PRN

## 2020-06-13 MED ORDER — ACETAMINOPHEN 325 MG PO TABS: 325.0000 mg | ORAL_TABLET | ORAL | Status: DC | PRN

## 2020-06-13 MED ORDER — WATER FOR IRRIGATION, STERILE IR SOLN
Status: DC | PRN
  Administered 2020-06-13: 2000 mL

## 2020-06-13 MED ORDER — ISOPROPYL ALCOHOL 70 % SOLN
Status: DC | PRN
  Administered 2020-06-13: 1 via TOPICAL

## 2020-06-13 MED ORDER — ONDANSETRON HCL 4 MG/2ML IJ SOLN
INTRAMUSCULAR | Status: DC | PRN
  Administered 2020-06-13: 4 mg via INTRAVENOUS

## 2020-06-13 MED ORDER — KETOROLAC TROMETHAMINE 30 MG/ML IJ SOLN
INTRAMUSCULAR | Status: DC | PRN
  Administered 2020-06-13: 30 mg

## 2020-06-13 SURGICAL SUPPLY — 56 items
BAG DECANTER FOR FLEXI CONT (MISCELLANEOUS) IMPLANT
BAG ZIPLOCK 12X15 (MISCELLANEOUS) IMPLANT
BLADE SURG SZ10 CARB STEEL (BLADE) IMPLANT
CHLORAPREP W/TINT 26 (MISCELLANEOUS) ×3 IMPLANT
COVER PERINEAL POST (MISCELLANEOUS) ×3 IMPLANT
COVER SURGICAL LIGHT HANDLE (MISCELLANEOUS) ×3 IMPLANT
COVER WAND RF STERILE (DRAPES) IMPLANT
CUP ACET PNNCL SECTR W/GRIP 56 (Hips) ×1 IMPLANT
DECANTER SPIKE VIAL GLASS SM (MISCELLANEOUS) ×3 IMPLANT
DERMABOND ADVANCED (GAUZE/BANDAGES/DRESSINGS) ×4
DERMABOND ADVANCED .7 DNX12 (GAUZE/BANDAGES/DRESSINGS) ×2 IMPLANT
DRAPE IMP U-DRAPE 54X76 (DRAPES) ×3 IMPLANT
DRAPE SHEET LG 3/4 BI-LAMINATE (DRAPES) ×9 IMPLANT
DRAPE STERI IOBAN 125X83 (DRAPES) IMPLANT
DRAPE U-SHAPE 47X51 STRL (DRAPES) ×6 IMPLANT
DRSG AQUACEL AG ADV 3.5X10 (GAUZE/BANDAGES/DRESSINGS) ×3 IMPLANT
ELECT REM PT RETURN 15FT ADLT (MISCELLANEOUS) ×3 IMPLANT
GAUZE SPONGE 4X4 12PLY STRL (GAUZE/BANDAGES/DRESSINGS) ×3 IMPLANT
GLOVE BIO SURGEON STRL SZ8.5 (GLOVE) ×6 IMPLANT
GLOVE BIOGEL PI IND STRL 8.5 (GLOVE) ×1 IMPLANT
GLOVE BIOGEL PI INDICATOR 8.5 (GLOVE) ×2
GOWN SPEC L3 XXLG W/TWL (GOWN DISPOSABLE) ×3 IMPLANT
HANDPIECE INTERPULSE COAX TIP (DISPOSABLE) ×2
HEAD CERAMIC DELTA 36 PLUS 1.5 (Hips) ×3 IMPLANT
HOLDER FOLEY CATH W/STRAP (MISCELLANEOUS) ×3 IMPLANT
HOOD PEEL AWAY FLYTE STAYCOOL (MISCELLANEOUS) ×12 IMPLANT
JET LAVAGE IRRISEPT WOUND (IRRIGATION / IRRIGATOR) ×3
KIT TURNOVER KIT A (KITS) IMPLANT
LAVAGE JET IRRISEPT WOUND (IRRIGATION / IRRIGATOR) ×1 IMPLANT
LINER NEUTRAL 52MMX36MMX56N (Liner) ×3 IMPLANT
MANIFOLD NEPTUNE II (INSTRUMENTS) ×3 IMPLANT
MARKER SKIN DUAL TIP RULER LAB (MISCELLANEOUS) ×3 IMPLANT
NDL SAFETY ECLIPSE 18X1.5 (NEEDLE) ×1 IMPLANT
NEEDLE HYPO 18GX1.5 SHARP (NEEDLE) ×2
NEEDLE SPNL 18GX3.5 QUINCKE PK (NEEDLE) ×3 IMPLANT
PACK ANTERIOR HIP CUSTOM (KITS) ×3 IMPLANT
PENCIL SMOKE EVACUATOR (MISCELLANEOUS) IMPLANT
PINN SECTOR W/GRIP ACE CUP 56 (Hips) ×3 IMPLANT
SAW OSC TIP CART 19.5X105X1.3 (SAW) ×3 IMPLANT
SEALER BIPOLAR AQUA 6.0 (INSTRUMENTS) ×3 IMPLANT
SET HNDPC FAN SPRY TIP SCT (DISPOSABLE) ×1 IMPLANT
STEM TRI LOC BPS SZ4 W GRIPTON (Hips) ×1 IMPLANT
SUT ETHIBOND NAB CT1 #1 30IN (SUTURE) ×6 IMPLANT
SUT MNCRL AB 3-0 PS2 18 (SUTURE) ×3 IMPLANT
SUT MNCRL AB 4-0 PS2 18 (SUTURE) ×3 IMPLANT
SUT MON AB 2-0 CT1 36 (SUTURE) ×6 IMPLANT
SUT STRATAFIX PDO 1 14 VIOLET (SUTURE) ×2
SUT STRATFX PDO 1 14 VIOLET (SUTURE) ×1
SUT VIC AB 2-0 CT1 27 (SUTURE) ×2
SUT VIC AB 2-0 CT1 TAPERPNT 27 (SUTURE) ×1 IMPLANT
SUTURE STRATFX PDO 1 14 VIOLET (SUTURE) ×1 IMPLANT
SYR 3ML LL SCALE MARK (SYRINGE) ×3 IMPLANT
TRAY FOLEY MTR SLVR 16FR STAT (SET/KITS/TRAYS/PACK) IMPLANT
TRI LOC BPS SZ 4 W GRIPTON (Hips) ×3 IMPLANT
WATER STERILE IRR 1000ML POUR (IV SOLUTION) ×3 IMPLANT
YANKAUER SUCT BULB TIP 10FT TU (MISCELLANEOUS) ×3 IMPLANT

## 2020-06-13 NOTE — Anesthesia Procedure Notes (Signed)
Date/Time: 06/13/2020 11:36 AM Performed by: Talbot Grumbling, CRNA Oxygen Delivery Method: Simple face mask

## 2020-06-13 NOTE — Transfer of Care (Signed)
Immediate Anesthesia Transfer of Care Note  Patient: Nathaniel Roberts  Procedure(s) Performed: TOTAL HIP ARTHROPLASTY ANTERIOR APPROACH (Right Hip)  Patient Location: PACU  Anesthesia Type:Spinal  Level of Consciousness: awake, alert  and oriented  Airway & Oxygen Therapy: Patient Spontanous Breathing and Patient connected to face mask oxygen  Post-op Assessment: Report given to RN and Post -op Vital signs reviewed and stable  Post vital signs: Reviewed and stable  Last Vitals:  Vitals Value Taken Time  BP    Temp    Pulse    Resp    SpO2      Last Pain:  Vitals:   06/13/20 0906  TempSrc: Oral  PainSc:       Patients Stated Pain Goal: 3 (25/48/62 8241)  Complications: No complications documented.

## 2020-06-13 NOTE — Op Note (Signed)
OPERATIVE REPORT  SURGEON: Rod Can, MD   ASSISTANT: Nehemiah Massed, PA-C.  PREOPERATIVE DIAGNOSIS: Right hip avascular necrosis.   POSTOPERATIVE DIAGNOSIS: Right hip avascular necrosis.   PROCEDURE: Right total hip arthroplasty, anterior approach.   IMPLANTS: DePuy Tri Lock stem, size 4, hi offset. DePuy Pinnacle Cup, size 56 mm. DePuy Altrx liner, size 36 by 54 mm, neutral. DePuy Biolox ceramic head ball, size 36 + 1.5 mm.  ANESTHESIA:  MAC and Spinal  ESTIMATED BLOOD LOSS:-300 mL    ANTIBIOTICS: 2 g Ancef.  DRAINS: None.  COMPLICATIONS: None.   CONDITION: PACU - hemodynamically stable.   BRIEF CLINICAL NOTE: Nathaniel Roberts is a 59 y.o. male with Right hip avascular necrosis with collapse. After failing conservative management, the patient was indicated for total hip arthroplasty. The risks, benefits, and alternatives to the procedure were explained, and the patient elected to proceed.  PROCEDURE IN DETAIL: Surgical site was marked by myself in the pre-op holding area. Once inside the operating room, spinal anesthesia was obtained, and a foley catheter was inserted. The patient was then positioned on the Hana table.  All bony prominences were well padded.  The hip was prepped and draped in the normal sterile surgical fashion.  A time-out was called verifying side and site of surgery. The patient received IV antibiotics within 60 minutes of beginning the procedure.   The direct anterior approach to the hip was performed through the Hueter interval.  Lateral femoral circumflex vessels were treated with the Auqumantys. The anterior capsule was exposed and an inverted T capsulotomy was made. The femoral neck cut was made to the level of the templated cut.  A corkscrew was placed into the head and the head was removed.  The femoral head was found to have eburnated bone. The head was passed to the back table and was measured.   Acetabular exposure was achieved, and the  pulvinar and labrum were excised. Sequential reaming of the acetabulum was then performed up to a size 55 mm reamer. A 56 mm cup was then opened and impacted into place at approximately 40 degrees of abduction and 20 degrees of anteversion. The final polyethylene liner was impacted into place and acetabular osteophytes were removed.    I then gained femoral exposure taking care to protect the abductors and greater trochanter.  This was performed using standard external rotation, extension, and adduction.  The capsule was peeled off the inner aspect of the greater trochanter, taking care to preserve the short external rotators. A cookie cutter was used to enter the femoral canal, and then the femoral canal finder was placed.  Sequential broaching was performed up to a size 4.  Calcar planer was used on the femoral neck remnant.  I placed a hi offset neck and a trial head ball.  The hip was reduced.  Leg lengths and offset were checked fluoroscopically.  The hip was dislocated and trial components were removed.  The final implants were placed, and the hip was reduced.  Fluoroscopy was used to confirm component position and leg lengths.  At 90 degrees of external rotation and full extension, the hip was stable to an anterior directed force.   The wound was copiously irrigated with Irrisept solution and normal saline using pule lavage.  Marcaine solution was injected into the periarticular soft tissue.  The wound was closed in layers using #1 Stratafix for the fascia, 2-0 Vicryl for the subcutaneous fat, 2-0 Monocryl for the deep dermal layer, 3-0 running Monocryl  subcuticular stitch, and Dermabond for the skin.  Once the glue was fully dried, an Aquacell Ag dressing was applied.  The patient was transported to the recovery room in stable condition.  Sponge, needle, and instrument counts were correct at the end of the case x2.  The patient tolerated the procedure well and there were no known complications.  Please  note that a surgical assistant was a medical necessity for this procedure to perform it in a safe and expeditious manner. Assistant was necessary to provide appropriate retraction of vital neurovascular structures, to prevent femoral fracture, and to allow for anatomic placement of the prosthesis.

## 2020-06-13 NOTE — Evaluation (Signed)
Physical Therapy Evaluation Patient Details Name: Nathaniel Roberts MRN: [id number] DOB: Feb 18, 1961 Today's Date: 06/13/2020   History of Present Illness  Patient is 59 y.o. male s/p Rt THA anterior approach with PMH significant for HTN, asthma, OA.  Clinical Impression  RYAN OGBORN is a 59 y.o. male POD 0 s/p Rt THA. Patient reports independence with mobility at baseline. Patient is now limited by functional impairments (see PT problem list below) and requires supervision for transfers and gait with RW. Patient was able to ambulate ~130 feet with RW and min guard/supervision and cues for safe walker management. Patient educated on safe sequencing for stair mobility and verbalized safe guarding position for people assisting with mobility. Patient instructed in exercises to facilitate ROM and circulation. Patient will benefit from continued skilled PT interventions to address impairments and progress towards PLOF. Patient has met mobility goals at adequate level for discharge home; will continue to follow if pt continues acute stay to progress towards Mod I goals.     Follow Up Recommendations Follow surgeon's recommendation for DC plan and follow-up therapies    Equipment Recommendations  None recommended by PT    Recommendations for Other Services       Precautions / Restrictions Precautions Precautions: Fall Restrictions Weight Bearing Restrictions: No Other Position/Activity Restrictions: WBAT      Mobility  Bed Mobility Overal bed mobility: Modified Independent        General bed mobility comments: no assist required, pt requries extra time/effort to rise from supine  Transfers Overall transfer level: Needs assistance Equipment used: Rolling walker (2 wheeled) Transfers: Sit to/from Stand Sit to Stand: Supervision;Min guard      General transfer comment: cues for technique with RW, no assist required and pt steady with rising. guarding/supervision for  safety.  Ambulation/Gait Ambulation/Gait assistance: Supervision;Min guard Gait Distance (Feet): 130 Feet Assistive device: Rolling walker (2 wheeled) Gait Pattern/deviations: Step-to pattern;Decreased stride length Gait velocity: fair   General Gait Details: cues for safe step sequencing and safe proximity to RW. no overt LOB noted.   Stairs Stairs: Yes Stairs assistance: Min guard Stair Management: No rails;Step to pattern;Backwards;With walker Number of Stairs: 2 General stair comments: cues for safe step sequencing "up with good, down with bad". cues for walker management and for safe guarding position for pt's mother or friend to help him.  Wheelchair Mobility    Modified Rankin (Stroke Patients Only)       Balance Overall balance assessment: Mild deficits observed, not formally tested            Pertinent Vitals/Pain Pain Assessment: No/denies pain Pain Score: 0-No pain    Home Living Family/patient expects to be discharged to:: Private residence Living Arrangements: Alone Available Help at Discharge: Family Type of Home: House Home Access: Stairs to enter Entrance Stairs-Rails: None Technical brewer of Steps: 2 Home Layout: One level Home Equipment: Environmental consultant - 2 wheels Additional Comments: pt's mother will stay with him for ~10 days    Prior Function Level of Independence: Independent         Comments: pt works as a Midwife   Dominant Hand: Right    Extremity/Trunk Assessment   Upper Extremity Assessment Upper Extremity Assessment: Overall WFL for tasks assessed    Lower Extremity Assessment Lower Extremity Assessment: Overall WFL for tasks assessed    Cervical / Trunk Assessment Cervical / Trunk Assessment: Normal  Communication   Communication: No difficulties  Cognition Arousal/Alertness: Awake/alert  Behavior During Therapy: WFL for tasks assessed/performed Overall Cognitive Status: Within Functional  Limits for tasks assessed               General Comments      Exercises Total Joint Exercises Ankle Circles/Pumps: AROM;Both;Seated;20 reps Quad Sets: AROM;Right;5 reps;Seated Short Arc Quad: AROM;Right;5 reps;Seated Heel Slides: AROM;Right;5 reps;Seated Hip ABduction/ADduction: AROM;5 reps;Right;Seated Long Arc Quad: AROM;5 reps;Right;Seated   Assessment/Plan    PT Assessment Patient needs continued PT services  PT Problem List Decreased strength;Decreased activity tolerance;Decreased range of motion;Decreased balance;Decreased mobility;Decreased knowledge of use of DME       PT Treatment Interventions DME instruction;Gait training;Functional mobility training;Stair training;Therapeutic exercise;Therapeutic activities;Balance training;Patient/family education    PT Goals (Current goals can be found in the Care Plan section)  Acute Rehab PT Goals Patient Stated Goal:  go home PT Goal Formulation: With patient Time For Goal Achievement: 06/22/20 Potential to Achieve Goals: Good    Frequency 7X/week    AM-PAC PT "6 Clicks" Mobility  Outcome Measure Help needed turning from your back to your side while in a flat bed without using bedrails?: None Help needed moving from lying on your back to sitting on the side of a flat bed without using bedrails?: None Help needed moving to and from a bed to a chair (including a wheelchair)?: A Little Help needed standing up from a chair using your arms (e.g., wheelchair or bedside chair)?: A Little Help needed to walk in hospital room?: A Little Help needed climbing 3-5 steps with a railing? : A Little 6 Click Score: 20    End of Session Equipment Utilized During Treatment: Gait belt Activity Tolerance: Patient tolerated treatment well Patient left: in chair;with call bell/phone within reach;with chair alarm set Nurse Communication: Mobility status PT Visit Diagnosis: Muscle weakness (generalized) (M62.81);Difficulty in walking,  not elsewhere classified (R26.2)    Time: 2482-5003 PT Time Calculation (min) (ACUTE ONLY): 42 min   Charges:   PT Evaluation $PT Eval Low Complexity: 1 Low PT Treatments $Gait Training: 8-22 mins $Therapeutic Exercise: 8-22 mins      Verner Mould, DPT Acute Rehabilitation Services  Office [phone] Pager [phone]  06/13/2020 7:15 PM

## 2020-06-13 NOTE — Discharge Instructions (Signed)
°Dr. Denia Mcvicar °Joint Replacement Specialist °[facility] °[address] °[place], [place] [address] °[phone] ° ° °TOTAL HIP REPLACEMENT POSTOPERATIVE DIRECTIONS ° ° ° °Hip Rehabilitation, Guidelines Following Surgery  ° °WEIGHT BEARING °Weight bearing as tolerated with assist device (walker, cane, etc) as directed, use it as long as suggested by your surgeon or therapist, typically at least 4-6 weeks. ° °The results of a hip operation are greatly improved after range of motion and muscle strengthening exercises. Follow all safety measures which are given to protect your hip. If any of these exercises cause increased pain or swelling in your joint, decrease the amount until you are comfortable again. Then slowly increase the exercises. Call your caregiver if you have problems or questions.  ° °HOME CARE INSTRUCTIONS  °Most of the following instructions are designed to prevent the dislocation of your new hip.  °Remove items at home which could result in a fall. This includes throw rugs or furniture in walking pathways.  °Continue medications as instructed at time of discharge. °· You may have some home medications which will be placed on hold until you complete the course of blood thinner medication. °· You may start showering once you are discharged home. Do not remove your dressing. °Do not put on socks or shoes without following the instructions of your caregivers.   °Sit on chairs with arms. Use the chair arms to help push yourself up when arising.  °Arrange for the use of a toilet seat elevator so you are not sitting low.  °· Walk with walker as instructed.  °You may resume a sexual relationship in one month or when given the OK by your caregiver.  °Use walker as long as suggested by your caregivers.  °You may put full weight on your legs and walk as much as is comfortable. °Avoid periods of inactivity such as sitting longer than an hour when not asleep. This helps prevent  blood clots.  °You may return to work once you are cleared by your surgeon.  °Do not drive a car for 6 weeks or until released by your surgeon.  °Do not drive while taking narcotics.  °Wear elastic stockings for two weeks following surgery during the day but you may remove then at night.  °Make sure you keep all of your appointments after your operation with all of your doctors and caregivers. You should call the office at the above phone number and make an appointment for approximately two weeks after the date of your surgery. °Please pick up a stool softener and laxative for home use as long as you are requiring pain medications. °· ICE to the affected hip every three hours for 30 minutes at a time and then as needed for pain and swelling. Continue to use ice on the hip for pain and swelling from surgery. You may notice swelling that will progress down to the foot and ankle.  This is normal after surgery.  Elevate the leg when you are not up walking on it.   °It is important for you to complete the blood thinner medication as prescribed by your doctor. °· Continue to use the breathing machine which will help keep your temperature down.  It is common for your temperature to cycle up and down following surgery, especially at night when you are not up moving around and exerting yourself.  The breathing machine keeps your lungs expanded and your temperature down. ° °RANGE OF MOTION AND STRENGTHENING EXERCISES  °These exercises are   designed to help you keep full movement of your hip joint. Follow your caregiver's or physical therapist's instructions. Perform all exercises about fifteen times, three times per day or as directed. Exercise both hips, even if you have had only one joint replacement. These exercises can be done on a training (exercise) mat, on the floor, on a table or on a bed. Use whatever works the best and is most comfortable for you. Use music or television while you are exercising so that the exercises  are a pleasant break in your day. This will make your life better with the exercises acting as a break in routine you can look forward to.  °Lying on your back, slowly slide your foot toward your buttocks, raising your knee up off the floor. Then slowly slide your foot back down until your leg is straight again.  °Lying on your back spread your legs as far apart as you can without causing discomfort.  °Lying on your side, raise your upper leg and foot straight up from the floor as far as is comfortable. Slowly lower the leg and repeat.  °Lying on your back, tighten up the muscle in the front of your thigh (quadriceps muscles). You can do this by keeping your leg straight and trying to raise your heel off the floor. This helps strengthen the largest muscle supporting your knee.  °Lying on your back, tighten up the muscles of your buttocks both with the legs straight and with the knee bent at a comfortable angle while keeping your heel on the floor.  ° °SKILLED REHAB INSTRUCTIONS: °If the patient is transferred to a skilled rehab facility following release from the hospital, a list of the current medications will be sent to the facility for the patient to continue.  When discharged from the skilled rehab facility, please have the facility set up the patient's Home Health Physical Therapy prior to being released. Also, the skilled facility will be responsible for providing the patient with their medications at time of release from the facility to include their pain medication and their blood thinner medication. If the patient is still at the rehab facility at time of the two week follow up appointment, the skilled rehab facility will also need to assist the patient in arranging follow up appointment in our office and any transportation needs. ° °MAKE SURE YOU:  °Understand these instructions.  °Will watch your condition.  °Will get help right away if you are not doing well or get worse. ° °Pick up stool softner and  laxative for home use following surgery while on pain medications. °Do not remove your dressing. °The dressing is waterproof--it is OK to take showers. °Continue to use ice for pain and swelling after surgery. °Do not use any lotions or creams on the incision until instructed by your surgeon. °Total Hip Protocol. ° ° °

## 2020-06-13 NOTE — Anesthesia Postprocedure Evaluation (Signed)
Anesthesia Post Note  Patient: Nathaniel Roberts  Procedure(s) Performed: TOTAL HIP ARTHROPLASTY ANTERIOR APPROACH (Right Hip)     Patient location during evaluation: PACU Anesthesia Type: MAC Level of consciousness: awake and alert Pain management: pain level controlled Vital Signs Assessment: post-procedure vital signs reviewed and stable Respiratory status: spontaneous breathing, nonlabored ventilation, respiratory function stable and patient connected to nasal cannula oxygen Cardiovascular status: stable and blood pressure returned to baseline Postop Assessment: no apparent nausea or vomiting Anesthetic complications: no   No complications documented.  Last Vitals:  Vitals:   06/13/20 1640 06/13/20 1800  BP: 132/82 132/82  Pulse: 82 98  Resp: 16 16  Temp: 36.9 C 36.8 C  SpO2: 96% 97%    Last Pain:  Vitals:   06/13/20 1800  TempSrc:   PainSc: 0-No pain                 Nesbit Michon

## 2020-06-13 NOTE — Discharge Summary (Signed)
Physician Discharge Summary  Patient ID: Nathaniel Roberts MRN: [id number] DOB/AGE: 1961-04-19 59 y.o.  Admit date: 06/13/2020 Discharge date: 06/13/2020  Admission Diagnoses:  Osteoarthritis of right hip  Discharge Diagnoses:  Principal Problem:   Osteoarthritis of right hip   Past Medical History:  Diagnosis Date  . Arthritis    ankles  . Asthma   . Hypertension     Surgeries: Procedure(s): TOTAL HIP ARTHROPLASTY ANTERIOR APPROACH on 06/13/2020   Consultants (if any):   Discharged Condition: Improved  Hospital Course: Nathaniel Roberts is an 59 y.o. male who was admitted 06/13/2020 with a diagnosis of Osteoarthritis of right hip and went to the operating room on 06/13/2020 and underwent the above named procedures.    He was given perioperative antibiotics:  Anti-infectives (From admission, onward)   Start     Dose/Rate Route Frequency Ordered Stop   06/13/20 0900  ceFAZolin (ANCEF) IVPB 2g/100 mL premix        2 g 200 mL/hr over 30 Minutes Intravenous On call to O.R. 06/13/20 0848 06/13/20 1149    .  He was given sequential compression devices, early ambulation, and ASA for DVT prophylaxis.  He benefited maximally from the hospital stay and there were no complications.    Recent vital signs:  Vitals:   06/13/20 0906  BP: 129/89  Pulse: 79  Resp: 18  Temp: 98.8 F (37.1 C)  SpO2: 97%    Recent laboratory studies:  Lab Results  Component Value Date   HGB 15.8 06/06/2020   HGB 15.7 06/05/2020   Lab Results  Component Value Date   WBC 5.3 06/06/2020   PLT 158 06/06/2020   Lab Results  Component Value Date   INR 0.9 06/05/2020   Lab Results  Component Value Date   NA 138 06/06/2020   K 4.7 06/06/2020   CL 106 06/06/2020   CO2 15 (L) 06/06/2020   BUN 24 06/06/2020   CREATININE 0.95 06/06/2020   GLUCOSE 84 06/06/2020    Discharge Medications:   Allergies as of 06/13/2020   No Known Allergies     Medication List    TAKE these medications   aspirin  81 MG chewable tablet Commonly known as: Aspirin Childrens Chew 1 tablet (81 mg total) by mouth 2 (two) times daily with a meal.   clobetasol cream 0.05 % Commonly known as: TEMOVATE Apply 1 application topically daily as needed (psoriasis).   docusate sodium 100 MG capsule Commonly known as: Colace Take 1 capsule (100 mg total) by mouth 2 (two) times daily.   GLUCOSAMINE-CHONDROITIN PO Take 2 tablets by mouth daily.   HYDROcodone-acetaminophen 5-325 MG tablet Commonly known as: Norco Take 1 tablet by mouth every 4 (four) hours as needed for moderate pain.   loratadine 10 MG tablet Commonly known as: CLARITIN Take 10 mg by mouth daily.   losartan-hydrochlorothiazide 100-12.5 MG tablet Commonly known as: HYZAAR Take 1 tablet by mouth daily.   multivitamin tablet Take 1 tablet by mouth daily.   naproxen sodium 220 MG tablet Commonly known as: ALEVE Take 220 mg by mouth in the morning.   ondansetron 4 MG tablet Commonly known as: Zofran Take 1 tablet (4 mg total) by mouth every 8 (eight) hours as needed for nausea or vomiting.   senna 8.6 MG Tabs tablet Commonly known as: SENOKOT Take 2 tablets (17.2 mg total) by mouth at bedtime.       Diagnostic Studies: No results found.  Disposition:  Follow-up Information    Arleta Ostrum, Aaron Edelman, MD. Schedule an appointment as soon as possible for a visit in 2 weeks.   Specialty: Orthopedic Surgery Why: For wound re-check Contact information: [address] [place] [address] [phone]                Signed: Hilton Cork Noorah Giammona 06/13/2020, 1:51 PM

## 2020-06-13 NOTE — Progress Notes (Signed)
Patient ambulated from [facility] to restroom with walker to assist; very stable gait.  Voided 175cc clear yellow urine.  Patient then dressed self and was discharged to home.

## 2020-06-13 NOTE — Interval H&P Note (Signed)
History and Physical Interval Note:  06/13/2020 10:36 AM  Nathaniel Roberts  has presented today for surgery, with the diagnosis of Avascular necrosis right hip with collapse.  The various methods of treatment have been discussed with the patient and family. After consideration of risks, benefits and other options for treatment, the patient has consented to  Procedure(s): TOTAL HIP ARTHROPLASTY ANTERIOR APPROACH (Right) as a surgical intervention.  The patient's history has been reviewed, patient examined, no change in status, stable for surgery.  I have reviewed the patient's chart and labs.  Questions were answered to the patient's satisfaction.     Hilton Cork Vennela Jutte

## 2020-06-13 NOTE — Anesthesia Procedure Notes (Signed)
Spinal  Patient location during procedure: OR Start time: 06/13/2020 11:41 AM End time: 06/13/2020 11:46 AM Staffing Anesthesiologist: Janeece Riggers, MD Preanesthetic Checklist Completed: patient identified, IV checked, site marked, risks and benefits discussed, surgical consent, monitors and equipment checked, pre-op evaluation and timeout performed Spinal Block Patient position: sitting Prep: DuraPrep Patient monitoring: heart rate, cardiac monitor, continuous pulse ox and blood pressure Approach: midline Location: L3-4 Injection technique: single-shot Needle Needle type: Sprotte  Needle gauge: 24 G Needle length: 9 cm Assessment Sensory level: T4

## 2020-06-14 ENCOUNTER — Encounter (HOSPITAL_COMMUNITY): Payer: Self-pay | Admitting: Orthopedic Surgery

## 2020-07-05 ENCOUNTER — Encounter: Payer: Self-pay | Admitting: Internal Medicine

## 2020-07-05 ENCOUNTER — Ambulatory Visit (INDEPENDENT_AMBULATORY_CARE_PROVIDER_SITE_OTHER): Payer: 59 | Admitting: Internal Medicine

## 2020-07-05 ENCOUNTER — Other Ambulatory Visit: Payer: Self-pay

## 2020-07-05 VITALS — Wt 210.2 lb

## 2020-07-05 DIAGNOSIS — J4520 Mild intermittent asthma, uncomplicated: Secondary | ICD-10-CM

## 2020-07-05 DIAGNOSIS — M1611 Unilateral primary osteoarthritis, right hip: Secondary | ICD-10-CM

## 2020-07-05 DIAGNOSIS — Z1211 Encounter for screening for malignant neoplasm of colon: Secondary | ICD-10-CM

## 2020-07-05 DIAGNOSIS — I10 Essential (primary) hypertension: Secondary | ICD-10-CM | POA: Diagnosis not present

## 2020-07-05 DIAGNOSIS — Z716 Tobacco abuse counseling: Secondary | ICD-10-CM

## 2020-07-05 DIAGNOSIS — M1610 Unilateral primary osteoarthritis, unspecified hip: Secondary | ICD-10-CM

## 2020-07-05 NOTE — Progress Notes (Addendum)
Established Patient Office Visit  SUBJECTIVE:  Subjective  Patient ID: Nathaniel Roberts, male    DOB: [birth year]  Age: 59 y.o. MRN: [id number]  CC:  Chief Complaint  Patient presents with  . Lab Results    HPI Nathaniel Roberts is a 59 y.o. male presenting today for an evaluation of his recent lab results.  He had a total right hip replacement on 06/13/2020. He still has his stitches in and will get   His CO2 levels were low, but he has a history of bronchial asthma.  His PSA was normal. His TSH was ok.  His CBC was normal.   Past Medical History:  Diagnosis Date  . Arthritis    ankles  . Asthma   . Hypertension     Past Surgical History:  Procedure Laterality Date  . COLONOSCOPY WITH PROPOFOL N/A 02/19/2018   Procedure: COLONOSCOPY WITH PROPOFOL;  Surgeon: Lucilla Lame, MD;  Location: [facility];  Service: Endoscopy;  Laterality: N/A;  . CYST REMOVAL LEG Left   . KNEE GANGLION EXCISION Right   . POLYPECTOMY  02/19/2018   Procedure: POLYPECTOMY;  Surgeon: Lucilla Lame, MD;  Location: [facility];  Service: Endoscopy;;  . TOTAL HIP ARTHROPLASTY Right 06/13/2020   Procedure: TOTAL HIP ARTHROPLASTY ANTERIOR APPROACH;  Surgeon: Rod Can, MD;  Location: [facility];  Service: Orthopedics;  Laterality: Right;    History reviewed. No pertinent family history.  Social History   Socioeconomic History  . Marital status: Single    Spouse name: Not on file  . Number of children: Not on file  . Years of education: Not on file  . Highest education level: Not on file  Occupational History  . Not on file  Tobacco Use  . Smoking status: Never Smoker  . Smokeless tobacco: Never Used  Vaping Use  . Vaping Use: Never used  Substance and Sexual Activity  . Alcohol use: Yes    Alcohol/week: 12.0 standard drinks    Types: 12 Glasses of wine per week    Comment: occas.  . Drug use: Never  . Sexual activity: Not on file  Other Topics Concern  . Not on file  Social  History Narrative  . Not on file   Social Determinants of Health   Financial Resource Strain:   . Difficulty of Paying Living Expenses:   Food Insecurity:   . Worried About Charity fundraiser in the Last Year:   . Arboriculturist in the Last Year:   Transportation Needs:   . Film/video editor (Medical):   Marland Kitchen Lack of Transportation (Non-Medical):   Physical Activity:   . Days of Exercise per Week:   . Minutes of Exercise per Session:   Stress:   . Feeling of Stress :   Social Connections:   . Frequency of Communication with Friends and Family:   . Frequency of Social Gatherings with Friends and Family:   . Attends Religious Services:   . Active Member of Clubs or Organizations:   . Attends Archivist Meetings:   Marland Kitchen Marital Status:   Intimate Partner Violence:   . Fear of Current or Ex-Partner:   . Emotionally Abused:   Marland Kitchen Physically Abused:   . Sexually Abused:      Current Outpatient Medications:  .  aspirin (ASPIRIN CHILDRENS) 81 MG chewable tablet, Chew 1 tablet (81 mg total) by mouth 2 (two) times daily with a meal., Disp: 90 tablet, Rfl: 0 .  loratadine (CLARITIN) 10 MG tablet, Take 10 mg by mouth daily., Disp: , Rfl:  .  losartan-hydrochlorothiazide (HYZAAR) 100-12.5 MG tablet, Take 1 tablet by mouth daily., Disp: , Rfl:  .  Multiple Vitamin (MULTIVITAMIN) tablet, Take 1 tablet by mouth daily., Disp: , Rfl:    No Known Allergies  ROS Review of Systems  Constitutional: Negative.   HENT: Negative.   Eyes: Negative.   Respiratory: Negative.   Cardiovascular: Negative.   Gastrointestinal: Negative.   Endocrine: Negative.   Genitourinary: Negative.   Musculoskeletal: Negative.   Skin: Negative.   Allergic/Immunologic: Negative.   Neurological: Negative.   Hematological: Negative.   Psychiatric/Behavioral: Negative.   All other systems reviewed and are negative.    OBJECTIVE:    Physical Exam Vitals reviewed.  Constitutional:       Appearance: Normal appearance.  Neck:     Vascular: No carotid bruit.  Cardiovascular:     Rate and Rhythm: Normal rate and regular rhythm.     Pulses: Normal pulses.     Heart sounds: Normal heart sounds.  Pulmonary:     Effort: Pulmonary effort is normal.     Breath sounds: Normal breath sounds.  Abdominal:     General: Bowel sounds are normal.     Palpations: Abdomen is soft. There is no hepatomegaly or splenomegaly.     Tenderness: There is no abdominal tenderness.     Hernia: No hernia is present.  Musculoskeletal:     Right lower leg: No edema.     Left lower leg: No edema.  Skin:    Findings: No rash.     Comments: Insect bite on right arm Stitches intact on right hip  Neurological:     Mental Status: He is alert and oriented to person, place, and time.  Psychiatric:        Mood and Affect: Mood normal.        Behavior: Behavior normal.     Wt 210 lb 3.2 oz (95.3 kg)   BMI 33.93 kg/m  Wt Readings from Last 3 Encounters:  07/05/20 210 lb 3.2 oz (95.3 kg)  06/13/20 208 lb 12.8 oz (94.7 kg)  06/06/20 208 lb 12.8 oz (94.7 kg)    Health Maintenance Due  Topic Date Due  . Hepatitis C Screening  Never done  . COVID-19 Vaccine (1) Never done  . HIV Screening  Never done  . TETANUS/TDAP  Never done    There are no preventive care reminders to display for this patient.  CBC Latest Ref Rng & Units 06/06/2020 06/05/2020  WBC 3.8 - 10.8 Thousand/uL 5.3 6.4  Hemoglobin 13.2 - 17.1 g/dL 15.8 15.7  Hematocrit 38 - 50 % 46.1 45.8  Platelets 140 - 400 Thousand/uL 158 191   CMP Latest Ref Rng & Units 06/06/2020 06/05/2020  Glucose 65 - 99 mg/dL 84 122(H)  BUN 7 - 25 mg/dL 24 17  Creatinine 0.70 - 1.33 mg/dL 0.95 0.94  Sodium 135 - 146 mmol/L 138 136  Potassium 3.5 - 5.3 mmol/L 4.7 3.7  Chloride 98 - 110 mmol/L 106 101  CO2 20 - 32 mmol/L 15(L) 26  Calcium 8.6 - 10.3 mg/dL 9.2 9.1  Total Protein 6.1 - 8.1 g/dL 7.0 7.7  Total Bilirubin 0.2 - 1.2 mg/dL 0.4 0.6  Alkaline  Phos 38 - 126 U/L - 59  AST 10 - 35 U/L 19 28  ALT 9 - 46 U/L 48(H) 62(H)    Lab Results  Component Value Date  TSH 2.34 06/06/2020   Lab Results  Component Value Date   ALBUMIN 4.3 06/05/2020   ANIONGAP 9 06/05/2020   No results found for: CHOL, HDL, LDLCALC, CHOLHDL No results found for: TRIG No results found for: HGBA1C    ASSESSMENT & PLAN:   Problem List Items Addressed This Visit      Cardiovascular and Mediastinum   Essential hypertension - Primary    Patient was advised to lose weight. , tobacco and  stress also causes of the blood pressure to go up, advised also to walk on a daily basis.  Not to drink alcohol.        Respiratory   Asthma    Patient was advised to take his asthma medicine regularly.  Lose weight.  He presents up-to-date on the vaccination.        Musculoskeletal and Integument   Osteoarthritis of right hip (Chronic)    Patient has a right hip arthroplasty.  Incision looks good without any evidence of infection.  Doing remarkably well.      Hip arthritis  Lt side   , follow up  With orthoe     Other   Special screening for malignant neoplasms, colon    He was told that exercise he gets better he need to get his colonoscopy.           Tobacco abuse counseling  Patient was advised to stop smoking.     No orders of the defined types were placed in this encounter.    I personally performed the services described in this documentation, which was SCRIBED in my presence. The recorded information has been reviewed and considered accurate. It has been edited as necessary during review. Cletis Athens, MD   Follow-up: Return in about 3 months (around 10/05/2020).    Dr. Jane Canary [facility] [address], [place], [place] [address]   By signing my name below, I, [employer], attest that this documentation has been prepared under the direction and in the presence of Cletis Athens, MD. Electronically Signed: Cletis Athens, MD 07/13/20, 12:17 PM   I personally performed the services described in this documentation, which was SCRIBED in my presence. The recorded information has been reviewed and considered accurate. It has been edited as necessary during review. Cletis Athens, MD

## 2020-07-05 NOTE — Patient Instructions (Signed)
Exercises To Do While Sitting  Exercises that you do while sitting (chair exercises) can give you many of the same benefits as full exercise. Benefits include strengthening your heart, burning calories, and keeping muscles and joints healthy. Exercise can also improve your mood and help with depression and anxiety. You may benefit from chair exercises if you are unable to do standing exercises because of:  Diabetic foot pain.  Obesity.  Illness.  Arthritis.  Recovery from surgery or injury.  Breathing problems.  Balance problems.  Another type of disability. Before starting chair exercises, check with your health care provider or a physical therapist to find out how much exercise you can tolerate and which exercises are safe for you. If your health care provider approves:  Start out slowly and build up over time. Aim to work up to about 10-20 minutes for each exercise session.  Make exercise part of your daily routine.  Drink water when you exercise. Do not wait until you are thirsty. Drink every 10-15 minutes.  Stop exercising right away if you have pain, nausea, shortness of breath, or dizziness.  If you are exercising in a wheelchair, make sure to lock the wheels.  Ask your health care provider whether you can do tai chi or yoga. Many positions in these mind-body exercises can be modified to do while seated.  Warm-up Before starting other exercises: 1. Sit up as straight as you can. Have your knees bent at 90 degrees, which is the shape of the capital letter "L." Keep your feet flat on the floor. 2. Sit at the front edge of your chair, if you can. 3. Pull in (tighten) the muscles in your abdomen and stretch your spine and neck as straight as you can. Hold this position for a few minutes. 4. Breathe in and out evenly. Try to concentrate on your breathing, and relax your mind. Stretching Exercise A: Arm stretch 1. Hold your arms out straight in front of your body. 2. Bend  your hands at the wrist with your fingers pointing up, as if signaling someone to stop. Notice the slight tension in your forearms as you hold the position. 3. Keeping your arms out and your hands bent, rotate your hands outward as far as you can and hold this stretch. Aim to have your thumbs pointing up and your pinkie fingers pointing down. Slowly repeat arm stretches for one minute as tolerated. Exercise B: Leg stretch 1. If you can move your legs, try to "draw" letters on the floor with the toes of your foot. Write your name with one foot. 2. Write your name with the toes of your other foot. Slowly repeat the movements for one minute as tolerated. Exercise C: Reach for the sky 1. Reach your hands as far over your head as you can to stretch your spine. 2. Move your hands and arms as if you are climbing a rope. Slowly repeat the movements for one minute as tolerated.  Range of motion exercises Exercise A: Shoulder roll 1. Let your arms hang loosely at your sides. 2. Lift just your shoulders up toward your ears, then let them relax back down. 3. When your shoulders feel loose, rotate your shoulders in backward and forward circles. Do shoulder rolls slowly for one minute as tolerated. Exercise B: March in place 1. As if you are marching, pump your arms and lift your legs up and down. Lift your knees as high as you can. ? If you are unable to lift  your knees, just pump your arms and move your ankles and feet up and down. March in place for one minute as tolerated. Exercise C: Seated jumping jacks 1. Let your arms hang down straight. 2. Keeping your arms straight, lift them up over your head. Aim to point your fingers to the ceiling. 3. While you lift your arms, straighten your legs and slide your heels along the floor to your sides, as wide as you can. 4. As you bring your arms back down to your sides, slide your legs back together. ? If you are unable to use your legs, just move your  arms. Slowly repeat seated jumping jacks for one minute as tolerated.  Strengthening exercises Exercise A: Shoulder squeeze 1. Hold your arms straight out from your body to your sides, with your elbows bent and your fists pointed at the ceiling. 2. Keeping your arms in the bent position, move them forward so your elbows and forearms meet in front of your face. 3. Open your arms back out as wide as you can with your elbows still bent, until you feel your shoulder blades squeezing together. Hold for 5 seconds. Slowly repeat the movements forward and backward for one minute as tolerated.  Contact a health care provider if you: 1. Had to stop exercising due to any of the following: ? Pain. ? Nausea. ? Shortness of breath. ? Dizziness. ? Fatigue. 2. Have significant pain or soreness after exercising.  Get help right away if you have:  Chest pain.  Difficulty breathing.  These symptoms may represent a serious problem that is an emergency. Do not wait to see if the symptoms will go away. Get medical help right away. Call your local emergency services (911 in the [place] Do not drive yourself to the hospital. This information is not intended to replace advice given to you by your health care provider. Make sure you discuss any questions you have with your health care provider.  Document Revised: 04/07/2019 Document Reviewed: 10/28/2017 Elsevier Patient Education  2020 [employer].

## 2020-07-07 ENCOUNTER — Encounter: Payer: Self-pay | Admitting: Internal Medicine

## 2020-07-07 NOTE — Assessment & Plan Note (Signed)
Patient was advised to take his asthma medicine regularly.  Lose weight.  He presents up-to-date on the vaccination.

## 2020-07-07 NOTE — Assessment & Plan Note (Signed)
He was told that exercise he gets better he need to get his colonoscopy.

## 2020-07-07 NOTE — Assessment & Plan Note (Signed)
Patient has a right hip arthroplasty.  Incision looks good without any evidence of infection.  Doing remarkably well.

## 2020-07-07 NOTE — Assessment & Plan Note (Signed)
Patient was advised to lose weight. , tobacco smoke, stress also causes of the blood pressure to go up, advised also to walk on a daily basis.  Not to drink alcohol.

## 2020-07-13 DIAGNOSIS — Z716 Tobacco abuse counseling: Secondary | ICD-10-CM | POA: Insufficient documentation

## 2020-08-27 ENCOUNTER — Other Ambulatory Visit: Payer: Self-pay | Admitting: Internal Medicine

## 2020-10-05 ENCOUNTER — Other Ambulatory Visit: Payer: Self-pay

## 2020-10-05 ENCOUNTER — Ambulatory Visit (INDEPENDENT_AMBULATORY_CARE_PROVIDER_SITE_OTHER): Payer: 59 | Admitting: Family Medicine

## 2020-10-05 ENCOUNTER — Encounter: Payer: Self-pay | Admitting: Family Medicine

## 2020-10-05 VITALS — BP 128/84 | HR 74 | Ht 66.0 in | Wt 209.4 lb

## 2020-10-05 DIAGNOSIS — M23307 Other meniscus derangements, unspecified meniscus, left knee: Secondary | ICD-10-CM | POA: Insufficient documentation

## 2020-10-05 DIAGNOSIS — Z01818 Encounter for other preprocedural examination: Secondary | ICD-10-CM | POA: Diagnosis not present

## 2020-10-05 DIAGNOSIS — I10 Essential (primary) hypertension: Secondary | ICD-10-CM | POA: Diagnosis not present

## 2020-10-05 DIAGNOSIS — M1610 Unilateral primary osteoarthritis, unspecified hip: Secondary | ICD-10-CM | POA: Diagnosis not present

## 2020-10-05 DIAGNOSIS — J4520 Mild intermittent asthma, uncomplicated: Secondary | ICD-10-CM | POA: Insufficient documentation

## 2020-10-05 NOTE — Progress Notes (Addendum)
Established Patient Office Visit  SUBJECTIVE:  Subjective  Patient ID: LOGIN MUCKLEROY, male    DOB: 1961/06/28  Age: 59 y.o. MRN: [id number]  CC:  Chief Complaint  Patient presents with  . Hypertension  . Knee Pain    HPI Nathaniel SCHALK is a 59 y.o. male presenting today for a hypertension check and evaluation of knee pain.   Hypertension This is a chronic problem. The current episode started more than 1 month ago. The problem is controlled. Risk factors for coronary artery disease include male gender, obesity and family history. The current treatment provides moderate improvement. There are no compliance problems.  There is no history of CAD/MI, CVA or heart failure. There is no history of a hypertension causing med or a thyroid problem.   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 He underwent right hip and left knee MRI on 04/24/2020. Left knee MRI revealed presumptive postoperative changes posterior horn lateral meniscus. Tiny free edge tear versus postoperative changes posterior horn medial meniscus. Small knee joint effusion. Overall minimal burden of degenerative changes as above.   He had been going to [facility], but he got insurance and his insurance would be out of network. He needs a referral to someone that is in network for him.     Past Medical History:  Diagnosis Date  . Arthritis    ankles  . Asthma   . Hypertension     Past Surgical History:  Procedure Laterality Date  . COLONOSCOPY WITH PROPOFOL N/A 02/19/2018   Procedure: COLONOSCOPY WITH PROPOFOL;  Surgeon: Lucilla Lame, MD;  Location: [facility];  Service: Endoscopy;  Laterality: N/A;  . CYST REMOVAL LEG Left   . KNEE GANGLION EXCISION Right   . POLYPECTOMY  02/19/2018   Procedure: POLYPECTOMY;  Surgeon: Lucilla Lame, MD;  Location: [facility];  Service: Endoscopy;;  . TOTAL HIP ARTHROPLASTY Right 06/13/2020   Procedure: TOTAL HIP  ARTHROPLASTY ANTERIOR APPROACH;  Surgeon: Rod Can, MD;  Location: [facility];  Service: Orthopedics;  Laterality: Right;    History reviewed. No pertinent family history.  Social History   Socioeconomic History  . Marital status: Single    Spouse name: Not on file  . Number of children: Not on file  . Years of education: Not on file  . Highest education level: Not on file  Occupational History  . Not on file  Tobacco Use  . Smoking status: Never Smoker  . Smokeless tobacco: Never Used  Vaping Use  . Vaping Use: Never used  Substance and Sexual Activity  . Alcohol use: Yes    Alcohol/week: 12.0 standard drinks    Types: 12 Glasses of wine per week    Comment: occas.  . Drug use: Never  . Sexual activity: Not on file  Other Topics Concern  . Not on file  Social History Narrative  . Not on file   Social Determinants of Health   Financial Resource Strain:   . Difficulty of Paying Living Expenses: Not on file  Food Insecurity:   . Worried About Charity fundraiser in the Last Year: Not on file  . Ran Out of Food in the Last Year: Not on file  Transportation Needs:   . Lack of Transportation (Medical): Not on file  . Lack of Transportation (Non-Medical): Not on file  Physical Activity:   . Days of Exercise per Week: Not on file  .  Minutes of Exercise per Session: Not on file  Stress:   . Feeling of Stress : Not on file  Social Connections:   . Frequency of Communication with Friends and Family: Not on file  . Frequency of Social Gatherings with Friends and Family: Not on file  . Attends Religious Services: Not on file  . Active Member of Clubs or Organizations: Not on file  . Attends Archivist Meetings: Not on file  . Marital Status: Not on file  Intimate Partner Violence:   . Fear of Current or Ex-Partner: Not on file  . Emotionally Abused: Not on file  . Physically Abused: Not on file  . Sexually Abused: Not on file     Current Outpatient  Medications:  .  loratadine (CLARITIN) 10 MG tablet, Take 10 mg by mouth daily., Disp: , Rfl:  .  losartan-hydrochlorothiazide (HYZAAR) 100-12.5 MG tablet, TAKE 1 TABLET BY MOUTH DAILY, Disp: 30 tablet, Rfl: 6 .  Multiple Vitamin (MULTIVITAMIN) tablet, Take 1 tablet by mouth daily., Disp: , Rfl:    No Known Allergies  ROS Review of Systems   OBJECTIVE:    Physical Exam  BP 128/84 (BP Location: Right Arm, Patient Position: Sitting)   Pulse 74   Ht 5\' 6"  (1.676 m)   Wt 209 lb 6.4 oz (95 kg)   BMI 33.80 kg/m  Wt Readings from Last 3 Encounters:  10/05/20 209 lb 6.4 oz (95 kg)  07/05/20 210 lb 3.2 oz (95.3 kg)  06/13/20 208 lb 12.8 oz (94.7 kg)    Health Maintenance Due  Topic Date Due  . Hepatitis C Screening  Never done  . COVID-19 Vaccine (1) Never done  . HIV Screening  Never done  . TETANUS/TDAP  Never done  . INFLUENZA VACCINE  Never done    There are no preventive care reminders to display for this patient.  CBC Latest Ref Rng & Units 06/06/2020 06/05/2020  WBC 3.8 - 10.8 Thousand/uL 5.3 6.4  Hemoglobin 13.2 - 17.1 g/dL 15.8 15.7  Hematocrit 38 - 50 % 46.1 45.8  Platelets 140 - 400 Thousand/uL 158 191   CMP Latest Ref Rng & Units 06/06/2020 06/05/2020  Glucose 65 - 99 mg/dL 84 122(H)  BUN 7 - 25 mg/dL 24 17  Creatinine 0.70 - 1.33 mg/dL 0.95 0.94  Sodium 135 - 146 mmol/L 138 136  Potassium 3.5 - 5.3 mmol/L 4.7 3.7  Chloride 98 - 110 mmol/L 106 101  CO2 20 - 32 mmol/L 15(L) 26  Calcium 8.6 - 10.3 mg/dL 9.2 9.1  Total Protein 6.1 - 8.1 g/dL 7.0 7.7  Total Bilirubin 0.2 - 1.2 mg/dL 0.4 0.6  Alkaline Phos 38 - 126 U/L - 59  AST 10 - 35 U/L 19 28  ALT 9 - 46 U/L 48(H) 62(H)    Lab Results  Component Value Date   TSH 2.34 06/06/2020   Lab Results  Component Value Date   ALBUMIN 4.3 06/05/2020   ANIONGAP 9 06/05/2020   No results found for: CHOL, HDL, LDLCALC, CHOLHDL No results found for: TRIG No results found for: HGBA1C    ASSESSMENT & PLAN:   Problem  List Items Addressed This Visit      Cardiovascular and Mediastinum   Essential hypertension - Primary    Repeat BP manual wnl today, no CP or sob other than occasional asthma flare.         Respiratory   Mild intermittent asthma without complication    Controlled  with OTC Primatine Mist, does not use Bronchial Dialators.         Musculoskeletal and Integument   Hip arthritis    Pain remains, meds help so that he can due activities of daily living.         Other   Preoperative clearance    Patient needing update on clearance for possible hip and knee surgery, BP stable today. He will get an appt with ortho this week.       Meniscus degeneration, left    MRI shows slight Meniscus tear, will f/u with ORTHo      Relevant Orders   Ambulatory referral to Orthopedic Surgery      No orders of the defined types were placed in this encounter.   Follow-up: No follow-ups on file.    Beckie Salts, [facility] [address], [place], [place] [address]   By signing my name below, I, [employer], attest that this documentation has been prepared under the direction and in the presence of Beckie Salts, FNP Electronically Signed: Beckie Salts, [place] 10/11/20, 1:57 PM  I personally performed the services described in this documentation, which was SCRIBED in my presence. The recorded information has been reviewed and considered accurate. It has been edited as necessary during review. Beckie Salts, FNP

## 2020-10-05 NOTE — Assessment & Plan Note (Signed)
Repeat BP manual wnl today, no CP or sob other than occasional asthma flare.

## 2020-10-05 NOTE — Assessment & Plan Note (Signed)
Controlled with OTC Primatine Mist, does not use Bronchial Dialators.

## 2020-10-11 NOTE — Assessment & Plan Note (Signed)
Pain remains, meds help so that he can due activities of daily living.

## 2020-10-11 NOTE — Assessment & Plan Note (Signed)
Patient needing update on clearance for possible hip and knee surgery, BP stable today. He will get an appt with ortho this week.

## 2020-10-11 NOTE — Assessment & Plan Note (Signed)
MRI shows slight Meniscus tear, will f/u with ORTHo

## 2020-12-12 ENCOUNTER — Other Ambulatory Visit: Payer: 59

## 2021-01-04 ENCOUNTER — Encounter
Admission: RE | Admit: 2021-01-04 | Discharge: 2021-01-04 | Disposition: A | Discharge status: Home / Self Care | Payer: 59 | Source: Ambulatory Visit | Attending: Orthopedic Surgery | Admitting: Orthopedic Surgery

## 2021-01-04 ENCOUNTER — Other Ambulatory Visit: Payer: Self-pay | Admitting: Orthopedic Surgery

## 2021-01-04 ENCOUNTER — Ambulatory Visit (INDEPENDENT_AMBULATORY_CARE_PROVIDER_SITE_OTHER): Payer: 59 | Admitting: Family Medicine

## 2021-01-04 ENCOUNTER — Encounter: Payer: Self-pay | Admitting: Urgent Care

## 2021-01-04 ENCOUNTER — Encounter: Payer: Self-pay | Admitting: Family Medicine

## 2021-01-04 ENCOUNTER — Other Ambulatory Visit: Payer: Self-pay

## 2021-01-04 ENCOUNTER — Encounter: Payer: Self-pay | Admitting: Orthopedic Surgery

## 2021-01-04 VITALS — BP 138/81 | HR 72 | Ht 66.5 in | Wt 215.3 lb

## 2021-01-04 DIAGNOSIS — Z01818 Encounter for other preprocedural examination: Secondary | ICD-10-CM

## 2021-01-04 HISTORY — DX: Gastro-esophageal reflux disease without esophagitis: K21.9

## 2021-01-04 NOTE — Progress Notes (Signed)
Established Patient Office Visit  SUBJECTIVE:  Subjective  Patient ID: Nathaniel Roberts, male    DOB: Jul 09, 1961  Age: 60 y.o. MRN: [id number]  CC:  Chief Complaint  Patient presents with  . Pre-op Exam    Patient is here for surgical clearance. Patient is scheduled for knee arthroscopy with medial menisectomy on January 17.     HPI Nathaniel Roberts is a 60 y.o. male presenting today for     Past Medical History:  Diagnosis Date  . Arthritis    ankles  . Asthma   . GERD (gastroesophageal reflux disease)   . Hypertension     Past Surgical History:  Procedure Laterality Date  . COLONOSCOPY WITH PROPOFOL N/A 02/19/2018   Procedure: COLONOSCOPY WITH PROPOFOL;  Surgeon: Lucilla Lame, MD;  Location: [facility];  Service: Endoscopy;  Laterality: N/A;  . CYST REMOVAL LEG Left   . INCISION AND DRAINAGE ABSCESS    . JOINT REPLACEMENT    . KNEE GANGLION EXCISION Right   . POLYPECTOMY  02/19/2018   Procedure: POLYPECTOMY;  Surgeon: Lucilla Lame, MD;  Location: [facility];  Service: Endoscopy;;  . TOTAL HIP ARTHROPLASTY Right 06/13/2020   Procedure: TOTAL HIP ARTHROPLASTY ANTERIOR APPROACH;  Surgeon: Rod Can, MD;  Location: [facility];  Service: Orthopedics;  Laterality: Right;    History reviewed. No pertinent family history.  Social History   Socioeconomic History  . Marital status: Single    Spouse name: Not on file  . Number of children: Not on file  . Years of education: Not on file  . Highest education level: Not on file  Occupational History  . Not on file  Tobacco Use  . Smoking status: Never Smoker  . Smokeless tobacco: Never Used  Vaping Use  . Vaping Use: Never used  Substance and Sexual Activity  . Alcohol use: Yes    Alcohol/week: 12.0 standard drinks    Types: 12 Glasses of wine per week    Comment: occas.  . Drug use: Never  . Sexual activity: Not on file  Other Topics Concern  . Not on file  Social History Narrative  . Not on file    Social Determinants of Health   Financial Resource Strain: Not on file  Food Insecurity: Not on file  Transportation Needs: Not on file  Physical Activity: Not on file  Stress: Not on file  Social Connections: Not on file  Intimate Partner Violence: Not on file     Current Outpatient Medications:  .  acetaminophen (TYLENOL) 650 MG CR tablet, Take 1,300 mg by mouth every 8 (eight) hours as needed for pain., Disp: , Rfl:  .  calcium carbonate (TUMS - DOSED IN MG ELEMENTAL CALCIUM) 500 MG chewable tablet, Chew 1-2 tablets by mouth at bedtime as needed for indigestion or heartburn., Disp: , Rfl:  .  clobetasol ointment (TEMOVATE) [id number] %, Apply 1 application topically daily as needed (psoriasis)., Disp: , Rfl:  .  Cyanocobalamin (B-12 PO), Take 1 tablet by mouth daily., Disp: , Rfl:  .  EPINEPHrine (PRIMATENE MIST) 0.125 MG/ACT AERO, Inhale 2 puffs into the lungs daily as needed (allergies)., Disp: , Rfl:  .  loratadine (CLARITIN) 10 MG tablet, Take 10 mg by mouth daily., Disp: , Rfl:  .  losartan-hydrochlorothiazide (HYZAAR) 100-12.5 MG tablet, TAKE 1 TABLET BY MOUTH DAILY, Disp: 30 tablet, Rfl: 6 .  Melatonin 10 MG CAPS, Take 10 mg by mouth at bedtime as needed (sleep)., Disp: ,  Rfl:  .  Multiple Vitamin (MULTIVITAMIN) tablet, Take 1 tablet by mouth daily., Disp: , Rfl:  .  naproxen sodium (ALEVE) 220 MG tablet, Take 220 mg by mouth daily as needed (pain)., Disp: , Rfl:    No Known Allergies  ROS Review of Systems  Constitutional: Negative.   HENT: Negative.   Respiratory: Negative.   Cardiovascular: Negative.   Musculoskeletal: Negative.   Neurological: Negative.   Psychiatric/Behavioral: Negative.      OBJECTIVE:    Physical Exam Vitals and nursing note reviewed.  Constitutional:      Appearance: Normal appearance.  HENT:     Nose: Nose normal.     Mouth/Throat:     Mouth: Mucous membranes are moist.  Cardiovascular:     Rate and Rhythm: Normal rate and regular  rhythm.  Pulmonary:     Effort: Pulmonary effort is normal.  Musculoskeletal:        General: Normal range of motion.     Cervical back: Normal range of motion.  Skin:    General: Skin is warm.  Neurological:     Mental Status: He is alert.  Psychiatric:        Mood and Affect: Mood normal.     BP 138/81   Pulse 72   Ht 5' 6.5" (1.689 m)   Wt 215 lb 4.8 oz (97.7 kg)   BMI 34.23 kg/m  Wt Readings from Last 3 Encounters:  01/04/21 215 lb 4.8 oz (97.7 kg)  01/04/21 210 lb (95.3 kg)  10/05/20 209 lb 6.4 oz (95 kg)    Health Maintenance Due  Topic Date Due  . Hepatitis C Screening  Never done  . COVID-19 Vaccine (1) Never done  . HIV Screening  Never done  . TETANUS/TDAP  Never done  . INFLUENZA VACCINE  Never done    There are no preventive care reminders to display for this patient.  CBC Latest Ref Rng & Units 06/06/2020 06/05/2020  WBC 3.8 - 10.8 Thousand/uL 5.3 6.4  Hemoglobin 13.2 - 17.1 g/dL 15.8 15.7  Hematocrit 38.5 - 50.0 % 46.1 45.8  Platelets 140 - 400 Thousand/uL 158 191   CMP Latest Ref Rng & Units 06/06/2020 06/05/2020  Glucose 65 - 99 mg/dL 84 122(H)  BUN 7 - 25 mg/dL 24 17  Creatinine 0.70 - 1.33 mg/dL 0.95 0.94  Sodium 135 - 146 mmol/L 138 136  Potassium 3.5 - 5.3 mmol/L 4.7 3.7  Chloride 98 - 110 mmol/L 106 101  CO2 20 - 32 mmol/L 15(L) 26  Calcium 8.6 - 10.3 mg/dL 9.2 9.1  Total Protein 6.1 - 8.1 g/dL 7.0 7.7  Total Bilirubin 0.2 - 1.2 mg/dL 0.4 0.6  Alkaline Phos 38 - 126 U/L - 59  AST 10 - 35 U/L 19 28  ALT 9 - 46 U/L 48(H) 62(H)    Lab Results  Component Value Date   TSH 2.34 06/06/2020   Lab Results  Component Value Date   ALBUMIN 4.3 06/05/2020   ANIONGAP 9 06/05/2020   No results found for: CHOL, HDL, LDLCALC, CHOLHDL No results found for: TRIG No results found for: HGBA1C    ASSESSMENT & PLAN:   Problem List Items Addressed This Visit      Other   Pre-operative clearance - Primary    Patient to have arthroscopic surgery on  left knee on January 17th. Normal ECG today, Asthma in excellent control, BP wnl. Patient is cleared for surgery today.  Relevant Orders   EKG 12-Lead      No orders of the defined types were placed in this encounter.     Follow-up: No follow-ups on file.    Beckie Salts, [facility] [address] [place], [place] [address]

## 2021-01-04 NOTE — H&P (Signed)
Nathaniel Roberts MRN:  [id number] DOB/SEX:  08-05-1961/male  CHIEF COMPLAINT:  Painful left Knee  HISTORY: Patient is a 60 y.o. male presented with a history of pain in the left knee. Onset of symptoms was abrupt starting several months ago with gradually worsening course since that time. Prior procedures on the knee include none. Patient has been treated conservatively with over-the-counter NSAIDs and activity modification. Patient currently rates pain in the knee at 10 out of 10 with activity. There is pain at night.  PAST MEDICAL HISTORY: Patient Active Problem List   Diagnosis Date Noted  . Meniscus degeneration, left 10/05/2020  . Mild intermittent asthma without complication 93/23/5573  . Tobacco abuse counseling 07/13/2020  . Osteoarthritis of right hip 06/13/2020  . Preoperative clearance 06/03/2020  . Hip arthritis 06/03/2020  . Essential hypertension 06/03/2020  . Asthma   . Special screening for malignant neoplasms, colon   . Polyp of sigmoid colon   . Benign neoplasm of ascending colon    Past Medical History:  Diagnosis Date  . Arthritis    ankles  . Asthma   . GERD (gastroesophageal reflux disease)   . Hypertension    Past Surgical History:  Procedure Laterality Date  . COLONOSCOPY WITH PROPOFOL N/A 02/19/2018   Procedure: COLONOSCOPY WITH PROPOFOL;  Surgeon: Lucilla Lame, MD;  Location: [facility];  Service: Endoscopy;  Laterality: N/A;  . CYST REMOVAL LEG Left   . INCISION AND DRAINAGE ABSCESS    . JOINT REPLACEMENT    . KNEE GANGLION EXCISION Right   . POLYPECTOMY  02/19/2018   Procedure: POLYPECTOMY;  Surgeon: Lucilla Lame, MD;  Location: [facility];  Service: Endoscopy;;  . TOTAL HIP ARTHROPLASTY Right 06/13/2020   Procedure: TOTAL HIP ARTHROPLASTY ANTERIOR APPROACH;  Surgeon: Rod Can, MD;  Location: [facility];  Service: Orthopedics;  Laterality: Right;     MEDICATIONS:  (Not in a hospital admission)   ALLERGIES:  No Known  Allergies  REVIEW OF SYSTEMS:  Pertinent items are noted in HPI.   FAMILY HISTORY:  No family history on file.  SOCIAL HISTORY:   Social History   Tobacco Use  . Smoking status: Never Smoker  . Smokeless tobacco: Never Used  Substance Use Topics  . Alcohol use: Yes    Alcohol/week: 12.0 standard drinks    Types: 12 Glasses of wine per week    Comment: occas.     EXAMINATION:  Vital signs in last 24 hours: @VSRANGES @  General appearance: alert, cooperative and no distress Neck: no JVD and supple, symmetrical, trachea midline Lungs: clear to auscultation bilaterally Heart: regular rate and rhythm, S1, S2 normal, no murmur, click, rub or gallop Abdomen: soft, non-tender; bowel sounds normal; no masses,  no organomegaly Extremities: extremities normal, atraumatic, no cyanosis or edema and Homans sign is negative, no sign of DVT Pulses: 2+ and symmetric Skin: Skin color, texture, turgor normal. No rashes or lesions Neurologic: Alert and oriented X 3, normal strength and tone. Normal symmetric reflexes. Normal coordination and gait  Musculoskeletal:  ROM 0-105, Ligaments intact,  Imaging Review Plain radiographs demonstrate mild degenerative joint disease of the left knee. The overall alignment is neutral. The bone quality appears to be good for age and reported activity level.  MRI LEFT Knee: medial meniscal tear  Assessment/Plan: Left knee medial meniscal tear  The patient history, physical examination and imaging studies are consistent with left knee medial meniscal tear. The patient has failed conservative treatment.  The clearance notes were  reviewed.  After discussion with the patient it was felt that Left knee arthroscopy was indicated. The procedure,  risks, and benefits of total knee arthroplasty were presented and reviewed. The risks including but not limited to aseptic loosening, infection, blood clots, vascular injury, stiffness, patella tracking problems  complications among others were discussed. The patient acknowledged the explanation, agreed to proceed with the plan.  Carlynn Spry PA-C 01/04/2021, 9:12 AM

## 2021-01-04 NOTE — Assessment & Plan Note (Signed)
Patient to have arthroscopic surgery on left knee on January 17th. Normal ECG today, Asthma in excellent control, BP wnl. Patient is cleared for surgery today.

## 2021-01-04 NOTE — Patient Instructions (Signed)
Your procedure is scheduled on: 01/15/20 Report to [place].  To find out your arrival time please call [phone] between 1PM - 3PM on 01/14/20.  Remember: Instructions that are not followed completely may result in serious medical risk, up to and including death, or upon the discretion of your surgeon and anesthesiologist your surgery may need to be rescheduled.     _X__ 1. Do not eat food after midnight the night before your procedure.                 No gum chewing or hard candies. You may drink clear liquids up to 2 hours                 before you are scheduled to arrive for your surgery- DO not drink clear                 liquids within 2 hours of the start of your surgery.                 Clear Liquids include:  water, apple juice without pulp, clear carbohydrate                 drink such as Clearfast or Gatorade, Black Coffee or Tea (Do not add                 anything to coffee or tea). Diabetics water only  __X__2.  On the morning of surgery brush your teeth with toothpaste and water, you                 may rinse your mouth with mouthwash if you wish.  Do not swallow any              toothpaste of mouthwash.     _X__ 3.  No Alcohol for 24 hours before or after surgery.   _X__ 4.  Do Not Smoke or use e-cigarettes For 24 Hours Prior to Your Surgery.                 Do not use any chewable tobacco products for at least 6 hours prior to                 surgery.  ____  5.  Bring all medications with you on the day of surgery if instructed.   __X__  6.  Notify your doctor if there is any change in your medical condition      (cold, fever, infections).     Do not wear jewelry, make-up, hairpins, clips or nail polish. Do not wear lotions, powders, or perfumes.  Do not shave 48 hours prior to surgery. Men may shave face and neck. Do not bring valuables to the hospital.    [facility] is not responsible for any belongings  or valuables.  Contacts, dentures/partials or body piercings may not be worn into surgery. Bring a case for your contacts, glasses or hearing aids, a denture cup will be supplied. Leave your suitcase in the car. After surgery it may be brought to your room. For patients admitted to the hospital, discharge time is determined by your treatment team.   Patients discharged the day of surgery will not be allowed to drive home.   Please read over the following fact sheets that you were given:   MRSA Information, CHG  __X__ Take these medicines the morning of surgery with A SIP OF WATER:  1. loratadine (CLARITIN) 10 MG tablet  2.   3.   4.  5.  6.  ____ Fleet Enema (as directed)   __X__ Use CHG Soap/SAGE wipes as directed  A second bottle of soap has been provided to be used daily for 5 days prior to your surgery.   ____ Use inhalers on the day of surgery  ____ Stop metformin/Janumet/Farxiga 2 days prior to surgery    ____ Take 1/2 of usual insulin dose the night before surgery. No insulin the morning          of surgery.   ____ Stop Blood Thinners Coumadin/Plavix/Xarelto/Pleta/Pradaxa/Eliquis/Effient/Aspirin  on   Or contact your Surgeon, Cardiologist or Medical Doctor regarding  ability to stop your blood thinners  __X__ Stop Anti-inflammatories 7 days before surgery such as Advil, Ibuprofen, Motrin,  BC or Goodies Powder, Naprosyn, Naproxen, Aleve, Aspirin    __X__ Stop all herbal supplements, fish oil or vitamin E for 7 days until after surgery.  Melatonin  ____ Bring C-Pap to the hospital.

## 2021-01-10 ENCOUNTER — Other Ambulatory Visit
Admission: RE | Admit: 2021-01-10 | Discharge: 2021-01-10 | Disposition: A | Discharge status: Home / Self Care | Payer: 59 | Source: Ambulatory Visit | Attending: Orthopedic Surgery | Admitting: Orthopedic Surgery

## 2021-01-10 ENCOUNTER — Other Ambulatory Visit: Payer: Self-pay

## 2021-01-10 DIAGNOSIS — Z01812 Encounter for preprocedural laboratory examination: Secondary | ICD-10-CM | POA: Diagnosis present

## 2021-01-10 DIAGNOSIS — Z20822 Contact with and (suspected) exposure to covid-19: Secondary | ICD-10-CM | POA: Insufficient documentation

## 2021-01-10 LAB — SARS CORONAVIRUS 2 (TAT 6-24 HRS): SARS Coronavirus 2: NEGATIVE

## 2021-01-14 ENCOUNTER — Ambulatory Visit: Admission: RE | Admit: 2021-01-14 | Payer: 59 | Source: Home / Self Care | Admitting: Orthopedic Surgery

## 2021-01-14 ENCOUNTER — Encounter: Admission: RE | Payer: Self-pay | Source: Home / Self Care

## 2021-01-14 SURGERY — ARTHROSCOPY, KNEE, WITH MEDIAL MENISCECTOMY
Anesthesia: General | Site: Knee | Laterality: Left

## 2021-01-17 ENCOUNTER — Other Ambulatory Visit: Payer: Self-pay | Admitting: *Deleted

## 2021-01-17 MED ORDER — CLOBETASOL PROPIONATE 0.05 % EX OINT: 1.0000 "application " | TOPICAL_OINTMENT | Freq: Every day | CUTANEOUS | 4 refills | Status: DC | PRN

## 2021-03-06 ENCOUNTER — Ambulatory Visit (INDEPENDENT_AMBULATORY_CARE_PROVIDER_SITE_OTHER): Payer: 59 | Admitting: Internal Medicine

## 2021-03-06 DIAGNOSIS — M069 Rheumatoid arthritis, unspecified: Secondary | ICD-10-CM | POA: Diagnosis not present

## 2021-03-07 LAB — CBC WITH DIFFERENTIAL/PLATELET
Absolute Monocytes: 573 cells/uL (ref 200–950)
Basophils Absolute: 62 cells/uL (ref 0–200)
Basophils Relative: 0.9 %
Eosinophils Absolute: 304 cells/uL (ref 15–500)
Eosinophils Relative: 4.4 %
HCT: 47 % (ref 38.5–50.0)
Hemoglobin: 16.3 g/dL (ref 13.2–17.1)
Lymphs Abs: 2160 cells/uL (ref 850–3900)
MCH: 33.5 pg — ABNORMAL HIGH (ref 27.0–33.0)
MCHC: 34.7 g/dL (ref 32.0–36.0)
MCV: 96.7 fL (ref 80.0–100.0)
MPV: 12.1 fL (ref 7.5–12.5)
Monocytes Relative: 8.3 %
Neutro Abs: 3802 cells/uL (ref 1500–7800)
Neutrophils Relative %: 55.1 %
Platelets: 239 10*3/uL (ref 140–400)
RBC: 4.86 10*6/uL (ref 4.20–5.80)
RDW: 11.9 % (ref 11.0–15.0)
Total Lymphocyte: 31.3 %
WBC: 6.9 10*3/uL (ref 3.8–10.8)

## 2021-03-07 LAB — LIPID PANEL
Cholesterol: 276 mg/dL — ABNORMAL HIGH (ref <200)
HDL: 44 mg/dL (ref >=40)
LDL Cholesterol (Calc): 175 mg/dL (calc) — ABNORMAL HIGH
Non-HDL Cholesterol (Calc): 232 mg/dL (calc) — ABNORMAL HIGH (ref <130)
Total CHOL/HDL Ratio: 6.3 (calc) — ABNORMAL HIGH (ref <5.0)
Triglycerides: 335 mg/dL — ABNORMAL HIGH (ref <150)

## 2021-03-07 LAB — RHEUMATOID FACTOR: Rheumatoid fact SerPl-aCnc: <14 IU/mL (ref <14)

## 2021-03-08 ENCOUNTER — Ambulatory Visit: Payer: 59 | Admitting: Family Medicine

## 2021-03-15 ENCOUNTER — Ambulatory Visit (INDEPENDENT_AMBULATORY_CARE_PROVIDER_SITE_OTHER): Payer: 59 | Admitting: Family Medicine

## 2021-03-15 ENCOUNTER — Other Ambulatory Visit: Payer: Self-pay

## 2021-03-15 ENCOUNTER — Encounter: Payer: Self-pay | Admitting: Family Medicine

## 2021-03-15 VITALS — BP 140/89 | HR 72 | Ht 66.5 in | Wt 212.0 lb

## 2021-03-15 DIAGNOSIS — E782 Mixed hyperlipidemia: Secondary | ICD-10-CM | POA: Insufficient documentation

## 2021-03-15 DIAGNOSIS — I10 Essential (primary) hypertension: Secondary | ICD-10-CM

## 2021-03-15 MED ORDER — FENOFIBRATE 145 MG PO TABS: 145.0000 mg | ORAL_TABLET | Freq: Every day | ORAL | 3 refills | Status: DC

## 2021-03-15 MED ORDER — ROSUVASTATIN CALCIUM 10 MG PO TABS: 10.0000 mg | ORAL_TABLET | Freq: Every day | ORAL | 3 refills | Status: DC

## 2021-03-15 NOTE — Assessment & Plan Note (Signed)
Patient's blood pressure is not within the desired range.  An office visit is recommended. Medication side effects include: no side effects noted Continue current treatment regimen. On no meds for BP and needs none at this time. Dietary sodium restriction.  His BP has not been elevated, will follow up in 2 weeks.

## 2021-03-15 NOTE — Progress Notes (Signed)
Established Patient Office Visit  SUBJECTIVE:  Subjective  Patient ID: Nathaniel Roberts, male    DOB: 12-Sep-1961  Age: 60 y.o. MRN: [id number]  CC:  Chief Complaint  Patient presents with  . Hypertension  . lab results    HPI Nathaniel Roberts is a 60 y.o. male presenting today for     Past Medical History:  Diagnosis Date  . Arthritis    ankles  . Asthma   . GERD (gastroesophageal reflux disease)   . Hypertension     Past Surgical History:  Procedure Laterality Date  . COLONOSCOPY WITH PROPOFOL N/A 02/19/2018   Procedure: COLONOSCOPY WITH PROPOFOL;  Surgeon: Lucilla Lame, MD;  Location: [place];  Service: Endoscopy;  Laterality: N/A;  . CYST REMOVAL LEG Left   . INCISION AND DRAINAGE ABSCESS    . JOINT REPLACEMENT    . KNEE GANGLION EXCISION Right   . POLYPECTOMY  02/19/2018   Procedure: POLYPECTOMY;  Surgeon: Lucilla Lame, MD;  Location: [place];  Service: Endoscopy;;  . TOTAL HIP ARTHROPLASTY Right 06/13/2020   Procedure: TOTAL HIP ARTHROPLASTY ANTERIOR APPROACH;  Surgeon: Rod Can, MD;  Location: [facility];  Service: Orthopedics;  Laterality: Right;    History reviewed. No pertinent family history.  Social History   Socioeconomic History  . Marital status: Single    Spouse name: Not on file  . Number of children: Not on file  . Years of education: Not on file  . Highest education level: Not on file  Occupational History  . Not on file  Tobacco Use  . Smoking status: Never Smoker  . Smokeless tobacco: Never Used  Vaping Use  . Vaping Use: Never used  Substance and Sexual Activity  . Alcohol use: Yes    Alcohol/week: 12.0 standard drinks    Types: 12 Glasses of wine per week    Comment: occas.  . Drug use: Never  . Sexual activity: Not on file  Other Topics Concern  . Not on file  Social History Narrative  . Not on file   Social Determinants of Health   Financial Resource Strain: Not on file  Food Insecurity: Not on file   Transportation Needs: Not on file  Physical Activity: Not on file  Stress: Not on file  Social Connections: Not on file  Intimate Partner Violence: Not on file     Current Outpatient Medications:  .  acetaminophen (TYLENOL) 650 MG CR tablet, Take 1,300 mg by mouth every 8 (eight) hours as needed for pain., Disp: , Rfl:  .  calcium carbonate (TUMS - DOSED IN MG ELEMENTAL CALCIUM) 500 MG chewable tablet, Chew 1-2 tablets by mouth at bedtime as needed for indigestion or heartburn., Disp: , Rfl:  .  clobetasol ointment (TEMOVATE) 7.84 %, Apply 1 application topically daily as needed (psoriasis)., Disp: 30 g, Rfl: 4 .  Cyanocobalamin (B-12 PO), Take 1 tablet by mouth daily., Disp: , Rfl:  .  EPINEPHrine (PRIMATENE MIST) 0.125 MG/ACT AERO, Inhale 2 puffs into the lungs daily as needed (allergies)., Disp: , Rfl:  .  loratadine (CLARITIN) 10 MG tablet, Take 10 mg by mouth daily., Disp: , Rfl:  .  losartan-hydrochlorothiazide (HYZAAR) 100-12.5 MG tablet, TAKE 1 TABLET BY MOUTH DAILY, Disp: 30 tablet, Rfl: 6 .  Melatonin 10 MG CAPS, Take 10 mg by mouth at bedtime as needed (sleep)., Disp: , Rfl:  .  Multiple Vitamin (MULTIVITAMIN) tablet, Take 1 tablet by mouth daily., Disp: , Rfl:  .  naproxen sodium (ALEVE) 220 MG tablet, Take 220 mg by mouth daily as needed (pain)., Disp: , Rfl:    No Known Allergies  ROS Review of Systems  Constitutional: Negative.   HENT: Negative.   Respiratory: Negative.   Cardiovascular: Negative.   Gastrointestinal: Negative.   Genitourinary: Negative.   Musculoskeletal: Negative.   Neurological: Negative.   Psychiatric/Behavioral: Negative.      OBJECTIVE:    Physical Exam Vitals and nursing note reviewed.  Constitutional:      Appearance: He is obese.  HENT:     Right Ear: Tympanic membrane normal.  Cardiovascular:     Rate and Rhythm: Normal rate and regular rhythm.  Pulmonary:     Effort: Pulmonary effort is normal.  Musculoskeletal:         General: Normal range of motion.  Skin:    General: Skin is warm.  Neurological:     Mental Status: He is alert.  Psychiatric:        Mood and Affect: Mood normal.     BP 140/89   Pulse 72   Ht 5' 6.5" (1.689 m)   Wt 212 lb (96.2 kg)   BMI 33.71 kg/m  Wt Readings from Last 3 Encounters:  03/15/21 212 lb (96.2 kg)  01/04/21 210 lb (95.3 kg)  01/04/21 215 lb 4.8 oz (97.7 kg)    Health Maintenance Due  Topic Date Due  . Hepatitis C Screening  Never done  . COVID-19 Vaccine (1) Never done  . HIV Screening  Never done  . TETANUS/TDAP  Never done  . INFLUENZA VACCINE  Never done    There are no preventive care reminders to display for this patient.  CBC Latest Ref Rng & Units 03/06/2021 06/06/2020 06/05/2020  WBC 3.8 - 10.8 Thousand/uL 6.9 5.3 6.4  Hemoglobin 13.2 - 17.1 g/dL 16.3 15.8 15.7  Hematocrit 38.5 - 50.0 % 47.0 46.1 45.8  Platelets 140 - 400 Thousand/uL 239 158 191   CMP Latest Ref Rng & Units 06/06/2020 06/05/2020  Glucose 65 - 99 mg/dL 84 122(H)  BUN 7 - 25 mg/dL 24 17  Creatinine 0.70 - 1.33 mg/dL 0.95 0.94  Sodium 135 - 146 mmol/L 138 136  Potassium 3.5 - 5.3 mmol/L 4.7 3.7  Chloride 98 - 110 mmol/L 106 101  CO2 20 - 32 mmol/L 15(L) 26  Calcium 8.6 - 10.3 mg/dL 9.2 9.1  Total Protein 6.1 - 8.1 g/dL 7.0 7.7  Total Bilirubin 0.2 - 1.2 mg/dL 0.4 0.6  Alkaline Phos 38 - 126 U/L - 59  AST 10 - 35 U/L 19 28  ALT 9 - 46 U/L 48(H) 62(H)    Lab Results  Component Value Date   TSH 2.34 06/06/2020   Lab Results  Component Value Date   ALBUMIN 4.3 06/05/2020   ANIONGAP 9 06/05/2020   Lab Results  Component Value Date   CHOL 276 (H) 03/06/2021   HDL 44 03/06/2021   LDLCALC 175 (H) 03/06/2021   CHOLHDL 6.3 (H) 03/06/2021   Lab Results  Component Value Date   TRIG 335 (H) 03/06/2021   No results found for: HGBA1C    ASSESSMENT & PLAN:   Problem List Items Addressed This Visit      Cardiovascular and Mediastinum   Essential hypertension    Patient's  blood pressure is not within the desired range.  An office visit is recommended. Medication side effects include: no side effects noted Continue current treatment regimen. On no meds for BP  and needs none at this time. Dietary sodium restriction.  His BP has not been elevated, will follow up in 2 weeks.          Other   Mixed hyperlipidemia - Primary      No orders of the defined types were placed in this encounter.     Follow-up: No follow-ups on file.    Beckie Salts, [place] [address] [place], [place] [address]

## 2021-03-16 LAB — COMPLETE METABOLIC PANEL WITH GFR
AG Ratio: 1.6 (calc) (ref 1.0–2.5)
ALT: 37 U/L (ref 9–46)
AST: 18 U/L (ref 10–35)
Albumin: 4.2 g/dL (ref 3.6–5.1)
Alkaline phosphatase (APISO): 56 U/L (ref 35–144)
BUN: 22 mg/dL (ref 7–25)
CO2: 21 mmol/L (ref 20–32)
Calcium: 9.6 mg/dL (ref 8.6–10.3)
Chloride: 103 mmol/L (ref 98–110)
Creat: 0.89 mg/dL (ref 0.70–1.25)
GFR, Est African American: 108 mL/min/{1.73_m2} (ref >=60)
GFR, Est Non African American: 93 mL/min/{1.73_m2} (ref >=60)
Globulin: 2.6 g/dL (calc) (ref 1.9–3.7)
Glucose, Bld: 98 mg/dL (ref 65–99)
Potassium: 4.5 mmol/L (ref 3.5–5.3)
Sodium: 137 mmol/L (ref 135–146)
Total Bilirubin: 0.4 mg/dL (ref 0.2–1.2)
Total Protein: 6.8 g/dL (ref 6.1–8.1)

## 2021-03-18 ENCOUNTER — Other Ambulatory Visit: Payer: Self-pay | Admitting: Internal Medicine

## 2021-03-29 ENCOUNTER — Ambulatory Visit (INDEPENDENT_AMBULATORY_CARE_PROVIDER_SITE_OTHER): Payer: 59 | Admitting: Family Medicine

## 2021-03-29 ENCOUNTER — Encounter: Payer: Self-pay | Admitting: Family Medicine

## 2021-03-29 ENCOUNTER — Other Ambulatory Visit: Payer: Self-pay

## 2021-03-29 VITALS — BP 125/82 | HR 82 | Ht 66.5 in | Wt 205.4 lb

## 2021-03-29 DIAGNOSIS — I10 Essential (primary) hypertension: Secondary | ICD-10-CM | POA: Diagnosis not present

## 2021-03-29 DIAGNOSIS — E782 Mixed hyperlipidemia: Secondary | ICD-10-CM | POA: Diagnosis not present

## 2021-03-29 NOTE — Assessment & Plan Note (Signed)
BP wnl on meds.

## 2021-03-29 NOTE — Progress Notes (Signed)
Established Patient Office Visit  SUBJECTIVE:  Subjective  Patient ID: Nathaniel Roberts, male    DOB: June 04, 1961  Age: 60 y.o. MRN: [id number]  CC:  Chief Complaint  Patient presents with  . lab results    HPI Nathaniel Roberts is a 60 y.o. male presenting today for     Past Medical History:  Diagnosis Date  . Arthritis    ankles  . Asthma   . GERD (gastroesophageal reflux disease)   . Hypertension     Past Surgical History:  Procedure Laterality Date  . COLONOSCOPY WITH PROPOFOL N/A 02/19/2018   Procedure: COLONOSCOPY WITH PROPOFOL;  Surgeon: Lucilla Lame, MD;  Location: [place];  Service: Endoscopy;  Laterality: N/A;  . CYST REMOVAL LEG Left   . INCISION AND DRAINAGE ABSCESS    . JOINT REPLACEMENT    . KNEE GANGLION EXCISION Right   . POLYPECTOMY  02/19/2018   Procedure: POLYPECTOMY;  Surgeon: Lucilla Lame, MD;  Location: [place];  Service: Endoscopy;;  . TOTAL HIP ARTHROPLASTY Right 06/13/2020   Procedure: TOTAL HIP ARTHROPLASTY ANTERIOR APPROACH;  Surgeon: Rod Can, MD;  Location: [facility];  Service: Orthopedics;  Laterality: Right;    History reviewed. No pertinent family history.  Social History   Socioeconomic History  . Marital status: Single    Spouse name: Not on file  . Number of children: Not on file  . Years of education: Not on file  . Highest education level: Not on file  Occupational History  . Not on file  Tobacco Use  . Smoking status: Never Smoker  . Smokeless tobacco: Never Used  Vaping Use  . Vaping Use: Never used  Substance and Sexual Activity  . Alcohol use: Yes    Alcohol/week: 12.0 standard drinks    Types: 12 Glasses of wine per week    Comment: occas.  . Drug use: Never  . Sexual activity: Not on file  Other Topics Concern  . Not on file  Social History Narrative  . Not on file   Social Determinants of Health   Financial Resource Strain: Not on file  Food Insecurity: Not on file  Transportation  Needs: Not on file  Physical Activity: Not on file  Stress: Not on file  Social Connections: Not on file  Intimate Partner Violence: Not on file     Current Outpatient Medications:  .  acetaminophen (TYLENOL) 650 MG CR tablet, Take 1,300 mg by mouth every 8 (eight) hours as needed for pain., Disp: , Rfl:  .  calcium carbonate (TUMS - DOSED IN MG ELEMENTAL CALCIUM) 500 MG chewable tablet, Chew 1-2 tablets by mouth at bedtime as needed for indigestion or heartburn., Disp: , Rfl:  .  clobetasol ointment (TEMOVATE) 5.42 %, Apply 1 application topically daily as needed (psoriasis)., Disp: 30 g, Rfl: 4 .  Cyanocobalamin (B-12 PO), Take 1 tablet by mouth daily., Disp: , Rfl:  .  EPINEPHrine (PRIMATENE MIST) 0.125 MG/ACT AERO, Inhale 2 puffs into the lungs daily as needed (allergies)., Disp: , Rfl:  .  fenofibrate (TRICOR) 145 MG tablet, Take 1 tablet (145 mg total) by mouth daily., Disp: 30 tablet, Rfl: 3 .  loratadine (CLARITIN) 10 MG tablet, Take 10 mg by mouth daily., Disp: , Rfl:  .  losartan-hydrochlorothiazide (HYZAAR) 100-12.5 MG tablet, TAKE 1 TABLET BY MOUTH DAILY, Disp: 30 tablet, Rfl: 6 .  Melatonin 10 MG CAPS, Take 10 mg by mouth at bedtime as needed (sleep)., Disp: ,  Rfl:  .  Multiple Vitamin (MULTIVITAMIN) tablet, Take 1 tablet by mouth daily., Disp: , Rfl:  .  naproxen sodium (ALEVE) 220 MG tablet, Take 220 mg by mouth daily as needed (pain)., Disp: , Rfl:  .  rosuvastatin (CRESTOR) 10 MG tablet, Take 1 tablet (10 mg total) by mouth daily., Disp: 90 tablet, Rfl: 3   No Known Allergies  ROS Review of Systems  Constitutional: Negative.   HENT: Negative.   Respiratory: Negative.   Cardiovascular: Negative.   Genitourinary: Negative.   Musculoskeletal: Negative.   Neurological: Negative.   Psychiatric/Behavioral: Negative.      OBJECTIVE:    Physical Exam Vitals and nursing note reviewed.  Constitutional:      Appearance: Normal appearance.  HENT:     Mouth/Throat:      Mouth: Mucous membranes are moist.  Cardiovascular:     Rate and Rhythm: Normal rate and regular rhythm.  Musculoskeletal:        General: Normal range of motion.  Neurological:     Mental Status: He is alert.     BP 125/82   Pulse 82   Ht 5' 6.5" (1.689 m)   Wt 205 lb 6.4 oz (93.2 kg)   BMI 32.66 kg/m  Wt Readings from Last 3 Encounters:  03/29/21 205 lb 6.4 oz (93.2 kg)  03/15/21 212 lb (96.2 kg)  01/04/21 210 lb (95.3 kg)    Health Maintenance Due  Topic Date Due  . Hepatitis C Screening  Never done  . COVID-19 Vaccine (1) Never done  . HIV Screening  Never done  . TETANUS/TDAP  Never done    There are no preventive care reminders to display for this patient.  CBC Latest Ref Rng & Units 03/06/2021 06/06/2020 06/05/2020  WBC 3.8 - 10.8 Thousand/uL 6.9 5.3 6.4  Hemoglobin 13.2 - 17.1 g/dL 16.3 15.8 15.7  Hematocrit 38.5 - 50.0 % 47.0 46.1 45.8  Platelets 140 - 400 Thousand/uL 239 158 191   CMP Latest Ref Rng & Units 03/15/2021 06/06/2020 06/05/2020  Glucose 65 - 99 mg/dL 98 84 122(H)  BUN 7 - 25 mg/dL 22 24 17   Creatinine 0.70 - 1.25 mg/dL 0.89 0.95 0.94  Sodium 135 - 146 mmol/L 137 138 136  Potassium 3.5 - 5.3 mmol/L 4.5 4.7 3.7  Chloride 98 - 110 mmol/L 103 106 101  CO2 20 - 32 mmol/L 21 15(L) 26  Calcium 8.6 - 10.3 mg/dL 9.6 9.2 9.1  Total Protein 6.1 - 8.1 g/dL 6.8 7.0 7.7  Total Bilirubin 0.2 - 1.2 mg/dL 0.4 0.4 0.6  Alkaline Phos 38 - 126 U/L - - 59  AST 10 - 35 U/L 18 19 28   ALT 9 - 46 U/L 37 48(H) 62(H)    Lab Results  Component Value Date   TSH 2.34 06/06/2020   Lab Results  Component Value Date   ALBUMIN 4.3 06/05/2020   ANIONGAP 9 06/05/2020   Lab Results  Component Value Date   CHOL 276 (H) 03/06/2021   HDL 44 03/06/2021   LDLCALC 175 (H) 03/06/2021   CHOLHDL 6.3 (H) 03/06/2021   Lab Results  Component Value Date   TRIG 335 (H) 03/06/2021   No results found for: HGBA1C    ASSESSMENT & PLAN:   Problem List Items Addressed This Visit       Cardiovascular and Mediastinum   Essential hypertension    BP wnl on meds.         Other   Mixed  hyperlipidemia - Primary    Patient in today to discuss labs, lifestyle and meds that were prescribed. He wants to try diet and exercise instead of meds.  Plan- Follow up in 6 months for lipid panel.          No orders of the defined types were placed in this encounter.     Follow-up: No follow-ups on file.    Beckie Salts, [place] [address], [place], [place] [address]

## 2021-03-29 NOTE — Assessment & Plan Note (Signed)
Patient in today to discuss labs, lifestyle and meds that were prescribed. He wants to try diet and exercise instead of meds.  Plan- Follow up in 6 months for lipid panel.

## 2021-08-15 IMAGING — DX DG PORTABLE PELVIS
1 series · 1 of 1 positions shown · non-contrast
Comparison: Intraoperative films from earlier in the same day.

CLINICAL DATA: Status post right hip replacement

EXAM:
PORTABLE PELVIS 1-2 VIEWS

[pelvis ap]
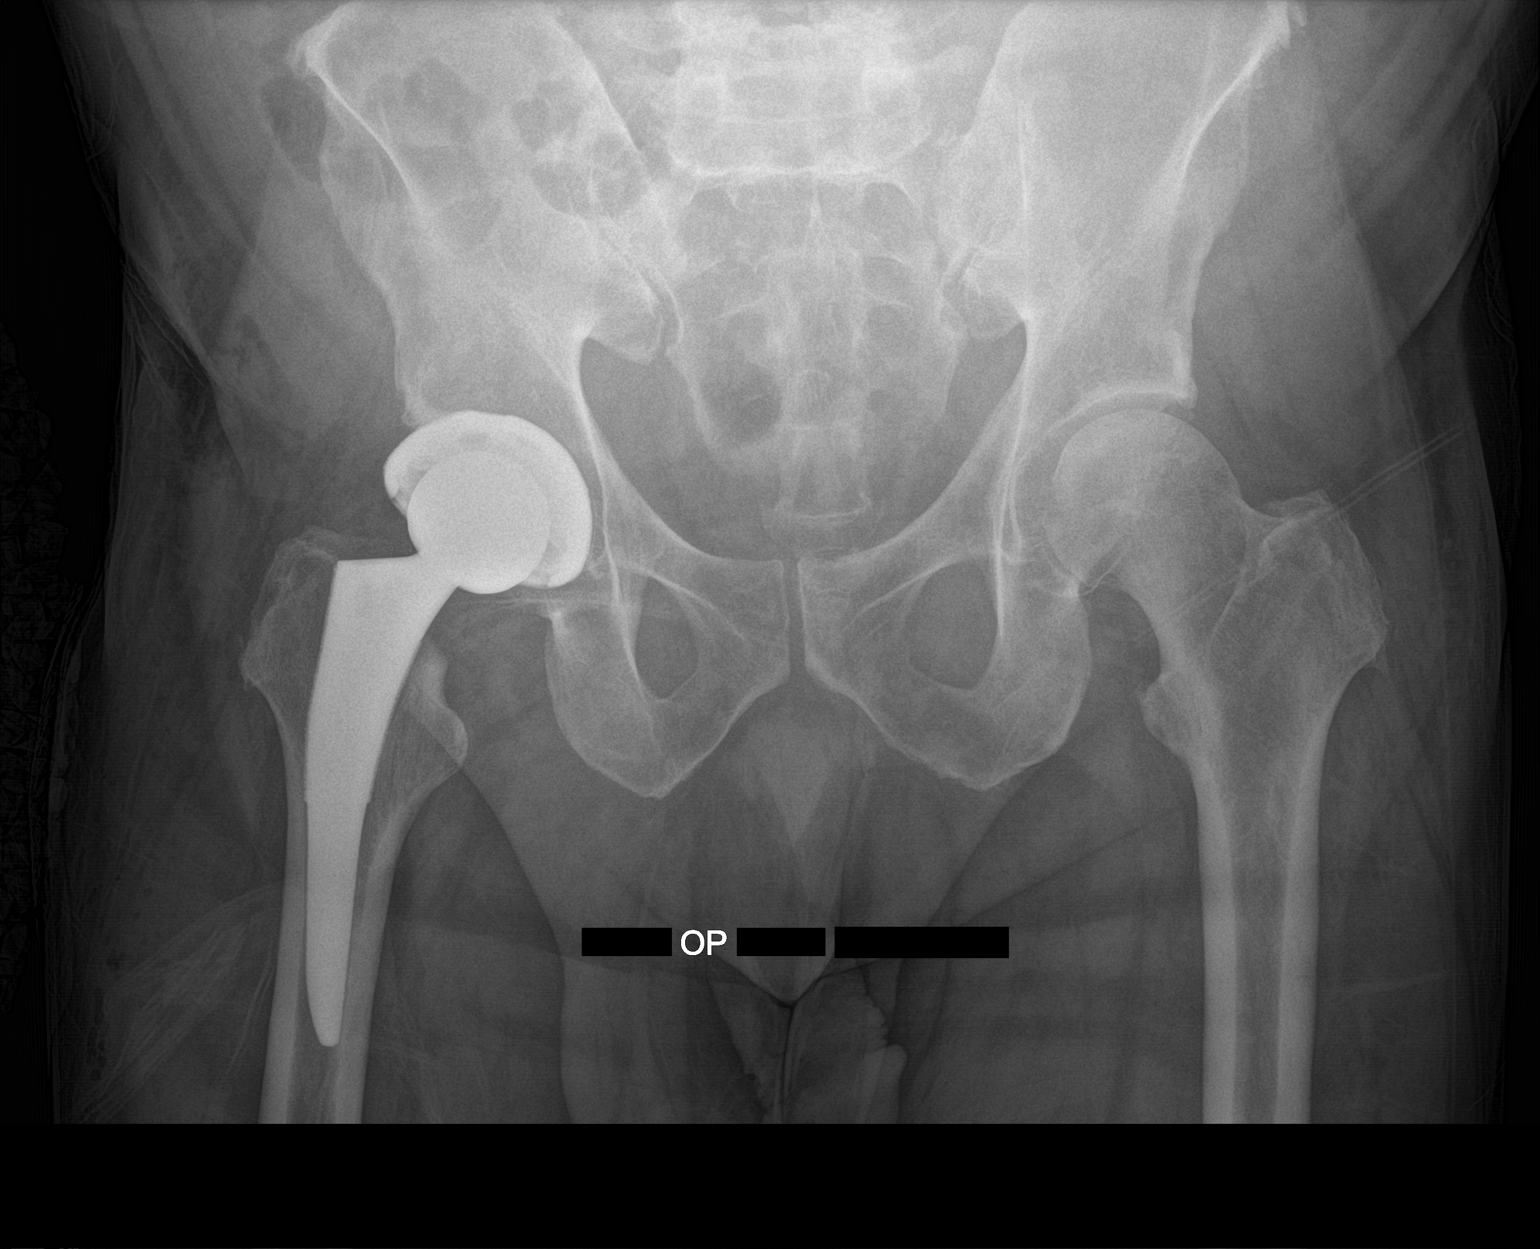

[1 of 1 positions shown; findings below may reference images not displayed]

FINDINGS: Right hip prosthesis is now seen. No acute bony or soft tissue
abnormality is noted.
IMPRESSION: Status post right hip replacement.

## 2021-08-15 IMAGING — RF DG HIP (WITH PELVIS) OPERATIVE*R*
1 series · 4 of 4 positions shown · non-contrast
Comparison: None.

CLINICAL DATA: Right hip replacement

EXAM:
OPERATIVE RIGHT HIP WITH PELVIS

[Series 1: unknown protocol · 0.20mm/px · 4 of 4 slices shown]
[im 1/4]
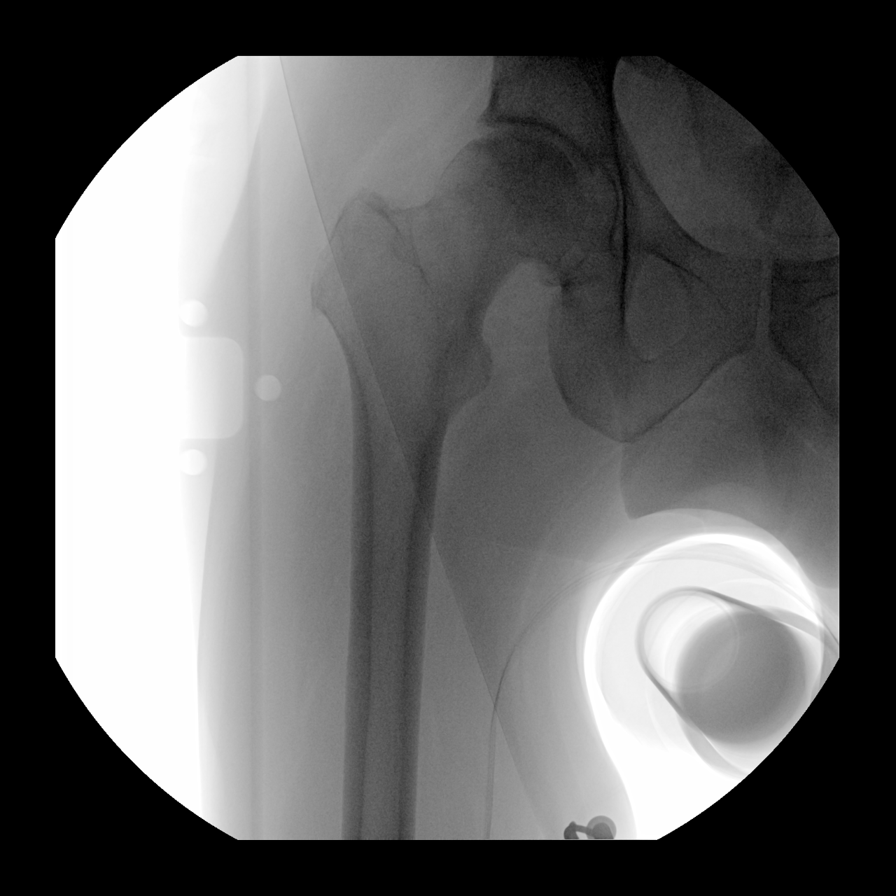
[im 2/4]
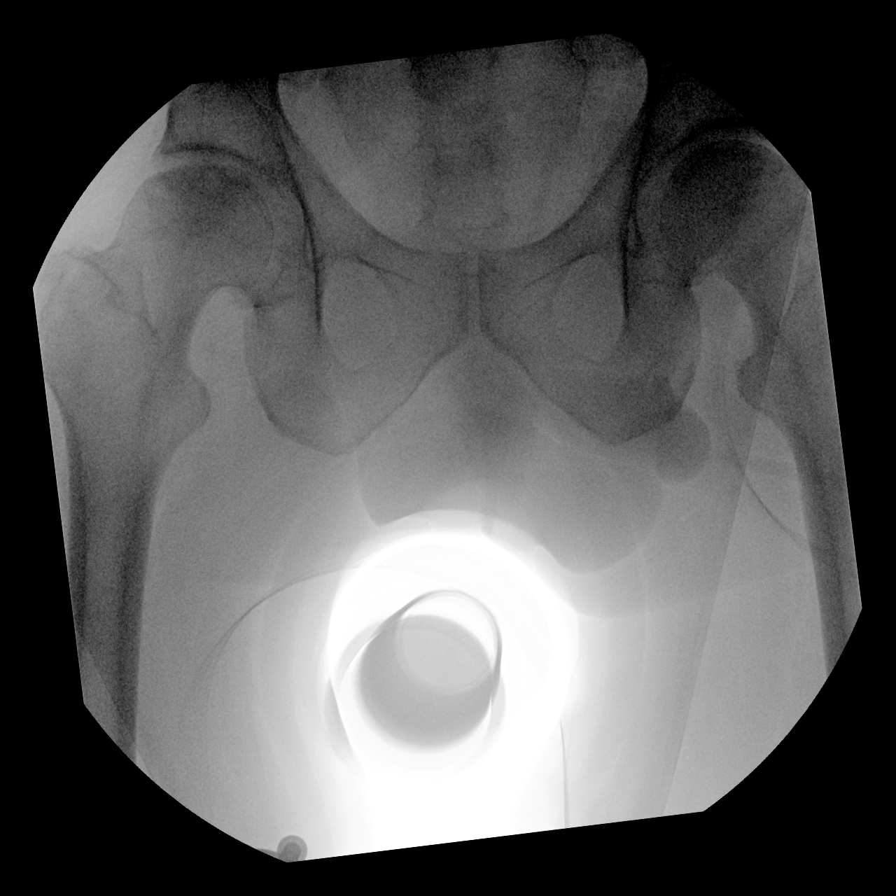
[im 3/4]
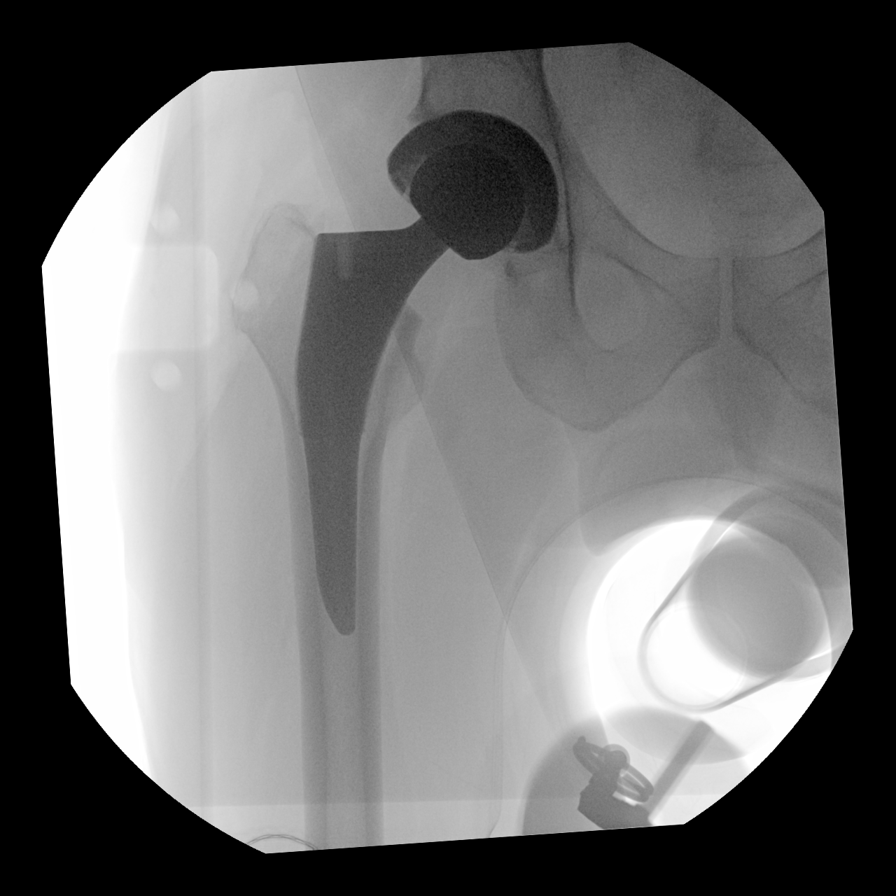
[im 4/4]
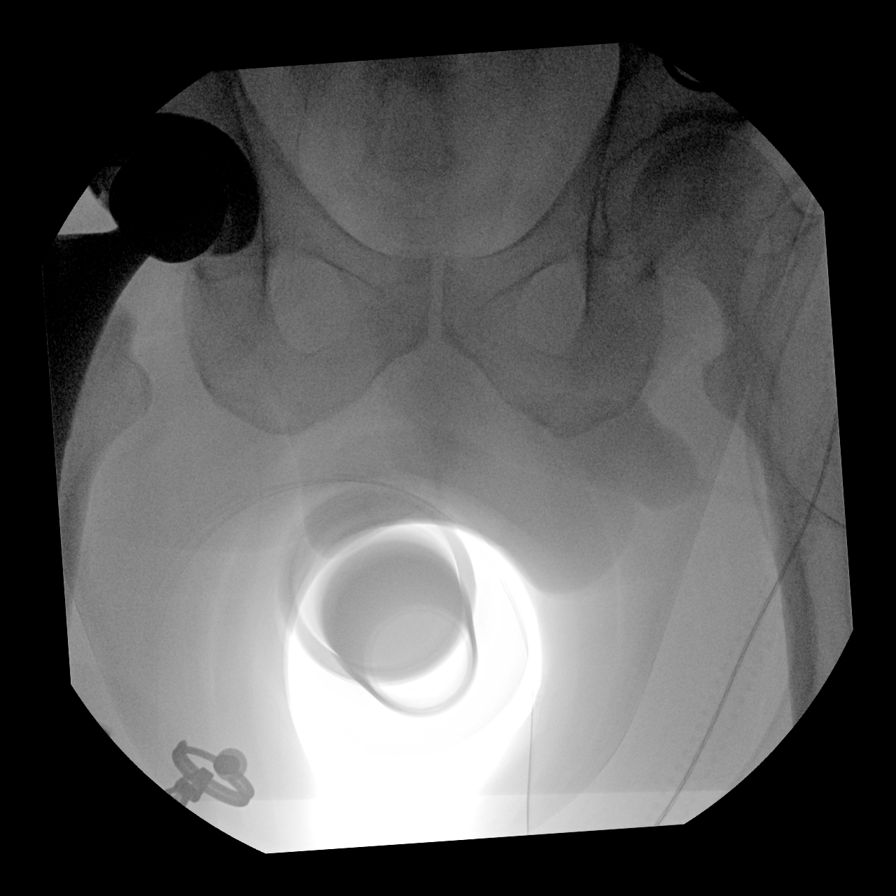

[4 of 4 positions shown; findings below may reference images not displayed]

FLUOROSCOPY TIME:  Radiation Exposure Index (as provided by the
fluoroscopic device): 1.34 mGy

If the device does not provide the exposure index:

Fluoroscopy Time:  6 seconds

Number of Acquired Images:  4
FINDINGS: Initial images demonstrate changes of avascular necrosis in the
right femoral head. Subsequent hip prosthesis is seen. No acute bony
or soft tissue abnormality is noted.
IMPRESSION: Status post right hip replacement

## 2021-09-11 ENCOUNTER — Ambulatory Visit (INDEPENDENT_AMBULATORY_CARE_PROVIDER_SITE_OTHER): Payer: 59 | Admitting: Internal Medicine

## 2021-09-11 ENCOUNTER — Encounter: Payer: Self-pay | Admitting: Internal Medicine

## 2021-09-11 ENCOUNTER — Other Ambulatory Visit: Payer: Self-pay

## 2021-09-11 VITALS — BP 164/96 | HR 56 | Ht 66.5 in | Wt 199.6 lb

## 2021-09-11 DIAGNOSIS — J4520 Mild intermittent asthma, uncomplicated: Secondary | ICD-10-CM | POA: Diagnosis not present

## 2021-09-11 DIAGNOSIS — M1610 Unilateral primary osteoarthritis, unspecified hip: Secondary | ICD-10-CM | POA: Diagnosis not present

## 2021-09-11 DIAGNOSIS — Z23 Encounter for immunization: Secondary | ICD-10-CM | POA: Diagnosis not present

## 2021-09-11 DIAGNOSIS — Z716 Tobacco abuse counseling: Secondary | ICD-10-CM

## 2021-09-11 DIAGNOSIS — I10 Essential (primary) hypertension: Secondary | ICD-10-CM | POA: Diagnosis not present

## 2021-09-11 DIAGNOSIS — E782 Mixed hyperlipidemia: Secondary | ICD-10-CM

## 2021-09-11 NOTE — Assessment & Plan Note (Signed)
Patient has arthritis of the left hip he would be sent to orthopedic surgeon for evaluation he also has arthritis of the left knee

## 2021-09-11 NOTE — Assessment & Plan Note (Signed)
-   I instructed the patient to stop smoking and provided them with smoking cessation materials.  - I informed the patient that smoking puts them at increased risk for cancer, COPD, hypertension, and more.  - Informed the patient to seek help if they begin to have trouble breathing, develop chest pain, start to cough up blood, feel faint, or pass out.  

## 2021-09-11 NOTE — Assessment & Plan Note (Signed)

## 2021-09-11 NOTE — Progress Notes (Signed)
Established Patient Office Visit  Subjective:  Patient ID: Nathaniel Roberts, male    DOB: 1961/12/18  Age: 60 y.o. MRN: [id number]  CC:  Chief Complaint  Patient presents with   Knee Pain    Patient complains of left knee pain.    Knee Pain    Nathaniel Roberts presents for check up  Past Medical History:  Diagnosis Date   Arthritis    ankles   Asthma    GERD (gastroesophageal reflux disease)    Hypertension     Past Surgical History:  Procedure Laterality Date   COLONOSCOPY WITH PROPOFOL N/A 02/19/2018   Procedure: COLONOSCOPY WITH PROPOFOL;  Surgeon: Lucilla Lame, MD;  Location: [place];  Service: Endoscopy;  Laterality: N/A;   CYST REMOVAL LEG Left    INCISION AND DRAINAGE ABSCESS     JOINT REPLACEMENT     KNEE GANGLION EXCISION Right    POLYPECTOMY  02/19/2018   Procedure: POLYPECTOMY;  Surgeon: Lucilla Lame, MD;  Location: [facility];  Service: Endoscopy;;   TOTAL HIP ARTHROPLASTY Right 06/13/2020   Procedure: TOTAL HIP ARTHROPLASTY ANTERIOR APPROACH;  Surgeon: Rod Can, MD;  Location: [facility];  Service: Orthopedics;  Laterality: Right;    No family history on file.  Social History   Socioeconomic History   Marital status: Single    Spouse name: Not on file   Number of children: Not on file   Years of education: Not on file   Highest education level: Not on file  Occupational History   Not on file  Tobacco Use   Smoking status: Never   Smokeless tobacco: Never  Vaping Use   Vaping Use: Never used  Substance and Sexual Activity   Alcohol use: Yes    Alcohol/week: 12.0 standard drinks    Types: 12 Glasses of wine per week    Comment: occas.   Drug use: Never   Sexual activity: Not on file  Other Topics Concern   Not on file  Social History Narrative   Not on file   Social Determinants of Health   Financial Resource Strain: Not on file  Food Insecurity: Not on file  Transportation Needs: Not on file  Physical Activity: Not  on file  Stress: Not on file  Social Connections: Not on file  Intimate Partner Violence: Not on file     Current Outpatient Medications:    acetaminophen (TYLENOL) 650 MG CR tablet, Take 1,300 mg by mouth every 8 (eight) hours as needed for pain., Disp: , Rfl:    calcium carbonate (TUMS - DOSED IN MG ELEMENTAL CALCIUM) 500 MG chewable tablet, Chew 1-2 tablets by mouth at bedtime as needed for indigestion or heartburn., Disp: , Rfl:    clobetasol ointment (TEMOVATE) [id number] %, Apply 1 application topically daily as needed (psoriasis)., Disp: 30 g, Rfl: 4   Cyanocobalamin (B-12 PO), Take 1 tablet by mouth daily., Disp: , Rfl:    EPINEPHrine (PRIMATENE MIST) 0.125 MG/ACT AERO, Inhale 2 puffs into the lungs daily as needed (allergies)., Disp: , Rfl:    fenofibrate (TRICOR) 145 MG tablet, Take 1 tablet (145 mg total) by mouth daily., Disp: 30 tablet, Rfl: 3   loratadine (CLARITIN) 10 MG tablet, Take 10 mg by mouth daily., Disp: , Rfl:    losartan-hydrochlorothiazide (HYZAAR) 100-12.5 MG tablet, TAKE 1 TABLET BY MOUTH DAILY, Disp: 30 tablet, Rfl: 6   Multiple Vitamin (MULTIVITAMIN) tablet, Take 1 tablet by mouth daily., Disp: , Rfl:  rosuvastatin (CRESTOR) 10 MG tablet, Take 1 tablet (10 mg total) by mouth daily., Disp: 90 tablet, Rfl: 3   No Known Allergies  ROS Review of Systems  Constitutional: Negative.   HENT: Negative.    Eyes: Negative.   Respiratory: Negative.    Cardiovascular: Negative.   Gastrointestinal: Negative.   Endocrine: Negative.   Genitourinary: Negative.   Musculoskeletal: Negative.   Skin: Negative.   Allergic/Immunologic: Negative.   Neurological: Negative.   Hematological: Negative.   Psychiatric/Behavioral: Negative.    All other systems reviewed and are negative.    Objective:    Physical Exam Vitals reviewed.  Constitutional:      Appearance: Normal appearance.  HENT:     Mouth/Throat:     Mouth: Mucous membranes are moist.  Eyes:     Pupils:  Pupils are equal, round, and reactive to light.  Neck:     Vascular: No carotid bruit.  Cardiovascular:     Rate and Rhythm: Normal rate and regular rhythm.     Pulses: Normal pulses.     Heart sounds: Normal heart sounds.  Pulmonary:     Effort: Pulmonary effort is normal.     Breath sounds: Normal breath sounds.  Abdominal:     General: Bowel sounds are normal.     Palpations: Abdomen is soft. There is no hepatomegaly, splenomegaly or mass.     Tenderness: There is no abdominal tenderness.     Hernia: No hernia is present.  Musculoskeletal:     Cervical back: Neck supple.     Right lower leg: No edema.     Left lower leg: No edema.  Skin:    Findings: No rash.  Neurological:     Mental Status: He is alert and oriented to person, place, and time.     Motor: No weakness.  Psychiatric:        Mood and Affect: Mood normal.        Behavior: Behavior normal.    BP (!) 164/96   Pulse (!) 56   Ht 5' 6.5" (1.689 m)   Wt 199 lb 9.6 oz (90.5 kg)   BMI 31.73 kg/m  Wt Readings from Last 3 Encounters:  09/11/21 199 lb 9.6 oz (90.5 kg)  03/29/21 205 lb 6.4 oz (93.2 kg)  03/15/21 212 lb (96.2 kg)     Health Maintenance Due  Topic Date Due   HIV Screening  Never done   Hepatitis C Screening  Never done   TETANUS/TDAP  Never done   Zoster Vaccines- Shingrix (1 of 2) Never done   COVID-19 Vaccine (3 - Booster for Janssen series) 04/04/2021   INFLUENZA VACCINE  Never done    There are no preventive care reminders to display for this patient.  Lab Results  Component Value Date   TSH 2.34 06/06/2020   Lab Results  Component Value Date   WBC 6.9 03/06/2021   HGB 16.3 03/06/2021   HCT 47.0 03/06/2021   MCV 96.7 03/06/2021   PLT 239 03/06/2021   Lab Results  Component Value Date   NA 137 03/15/2021   K 4.5 03/15/2021   CO2 21 03/15/2021   GLUCOSE 98 03/15/2021   BUN 22 03/15/2021   CREATININE 0.89 03/15/2021   BILITOT 0.4 03/15/2021   ALKPHOS 59 06/05/2020   AST  18 03/15/2021   ALT 37 03/15/2021   PROT 6.8 03/15/2021   ALBUMIN 4.3 06/05/2020   CALCIUM 9.6 03/15/2021   ANIONGAP 9 06/05/2020  Lab Results  Component Value Date   CHOL 276 (H) 03/06/2021   Lab Results  Component Value Date   HDL 44 03/06/2021   Lab Results  Component Value Date   LDLCALC 175 (H) 03/06/2021   Lab Results  Component Value Date   TRIG 335 (H) 03/06/2021   Lab Results  Component Value Date   CHOLHDL 6.3 (H) 03/06/2021   No results found for: HGBA1C    Assessment & Plan:  Patient was suggested statin for the high lipids, he will be referred to the orthopedic surgeon for the arthritis of the hip he states that his left hip and left knee is bothering him too much. Problem List Items Addressed This Visit   None   No orders of the defined types were placed in this encounter.   Follow-up: No follow-ups on file.    Cletis Athens, MD

## 2021-09-11 NOTE — Assessment & Plan Note (Signed)
Stable at the present time. 

## 2021-09-11 NOTE — Assessment & Plan Note (Signed)
Hypercholesterolemia  I advised the patient to follow Mediterranean diet This diet is rich in fruits vegetables and whole grain, and This diet is also rich in fish and lean meat Patient should also eat a handful of almonds or walnuts daily Recent heart study indicated that average follow-up on this kind of diet reduces the cardiovascular mortality by 50 to 70%== 

## 2021-09-12 NOTE — Addendum Note (Signed)
Addended by: Anson [place] R on: 09/12/2021 01:10 PM   Modules accepted: Orders

## 2021-09-13 ENCOUNTER — Other Ambulatory Visit: Payer: Self-pay | Admitting: Internal Medicine

## 2021-10-04 ENCOUNTER — Encounter: Payer: 59 | Admitting: Family Medicine

## 2022-01-08 ENCOUNTER — Other Ambulatory Visit: Payer: Self-pay | Admitting: Internal Medicine

## 2022-02-10 ENCOUNTER — Other Ambulatory Visit: Payer: Self-pay | Admitting: Internal Medicine

## 2022-02-21 ENCOUNTER — Other Ambulatory Visit: Payer: Self-pay

## 2022-02-21 ENCOUNTER — Encounter: Payer: Self-pay | Admitting: Nurse Practitioner

## 2022-02-21 ENCOUNTER — Ambulatory Visit (INDEPENDENT_AMBULATORY_CARE_PROVIDER_SITE_OTHER): Payer: 59 | Admitting: Nurse Practitioner

## 2022-02-21 VITALS — BP 168/97 | HR 75 | Ht 66.5 in | Wt 209.4 lb

## 2022-02-21 DIAGNOSIS — E669 Obesity, unspecified: Secondary | ICD-10-CM | POA: Diagnosis not present

## 2022-02-21 DIAGNOSIS — E782 Mixed hyperlipidemia: Secondary | ICD-10-CM | POA: Diagnosis not present

## 2022-02-21 DIAGNOSIS — J4520 Mild intermittent asthma, uncomplicated: Secondary | ICD-10-CM | POA: Diagnosis not present

## 2022-02-21 DIAGNOSIS — I10 Essential (primary) hypertension: Secondary | ICD-10-CM | POA: Diagnosis not present

## 2022-02-21 DIAGNOSIS — R5383 Other fatigue: Secondary | ICD-10-CM | POA: Insufficient documentation

## 2022-02-21 DIAGNOSIS — Z6834 Body mass index (BMI) 34.0-34.9, adult: Secondary | ICD-10-CM | POA: Insufficient documentation

## 2022-02-21 DIAGNOSIS — Z6833 Body mass index (BMI) 33.0-33.9, adult: Secondary | ICD-10-CM | POA: Diagnosis not present

## 2022-02-21 MED ORDER — AMLODIPINE BESYLATE 5 MG PO TABS: 5.0000 mg | ORAL_TABLET | Freq: Every day | ORAL | 1 refills | Status: DC

## 2022-02-21 NOTE — Assessment & Plan Note (Signed)
BMI 33.29 Advised patient to lose weight.

## 2022-02-21 NOTE — Assessment & Plan Note (Addendum)
BP 168/97 in the office today. Pt blood pressure not controlled. Pt is taking losartan-HCTZ 100-12.5 mg once daily. Advised pt to limit the intake of sodium. Advised pt to eat heart healthy diet and perform regular physical activity. Advised pt to measure the BP at home and bring it to the next appointment. Added amlodipine 5 mg once daily.  Labs ordered.

## 2022-02-21 NOTE — Assessment & Plan Note (Signed)
Lipid panel ordered. Advised pt to lose weight and eat healthy including more fruits, vegetables, whole grains and protein. Continue rosuvastatin 10 mg and fenofibrate.

## 2022-02-21 NOTE — Progress Notes (Addendum)
Established Patient Office Visit  Subjective:  Patient ID: Nathaniel Roberts, male    DOB: 1961-12-11  Age: 61 y.o. MRN: [id number]  CC:  Chief Complaint  Patient presents with   Hypertension    Patient is here for blood pressure check up     HPI  Nathaniel Roberts presents for follow up on blood pressure.  Hypertension This is a chronic problem. The current episode started more than 1 year ago. The problem has been gradually worsening since onset. The problem is uncontrolled. Pertinent negatives include no chest pain, headaches or shortness of breath. Risk factors for coronary artery disease include dyslipidemia and obesity. Past treatments include angiotensin blockers and diuretics.    Past Medical History:  Diagnosis Date   Arthritis    ankles   Asthma    GERD (gastroesophageal reflux disease)    Hypertension     Past Surgical History:  Procedure Laterality Date   COLONOSCOPY WITH PROPOFOL N/A 02/19/2018   Procedure: COLONOSCOPY WITH PROPOFOL;  Surgeon: Lucilla Lame, MD;  Location: [place];  Service: Endoscopy;  Laterality: N/A;   CYST REMOVAL LEG Left    INCISION AND DRAINAGE ABSCESS     JOINT REPLACEMENT     KNEE GANGLION EXCISION Right    POLYPECTOMY  02/19/2018   Procedure: POLYPECTOMY;  Surgeon: Lucilla Lame, MD;  Location: [place];  Service: Endoscopy;;   TOTAL HIP ARTHROPLASTY Right 06/13/2020   Procedure: TOTAL HIP ARTHROPLASTY ANTERIOR APPROACH;  Surgeon: Rod Can, MD;  Location: [facility];  Service: Orthopedics;  Laterality: Right;    History reviewed. No pertinent family history.  Social History   Socioeconomic History   Marital status: Single    Spouse name: Not on file   Number of children: Not on file   Years of education: Not on file   Highest education level: Not on file  Occupational History   Not on file  Tobacco Use   Smoking status: Never   Smokeless tobacco: Never  Vaping Use   Vaping Use: Never used  Substance and  Sexual Activity   Alcohol use: Yes    Alcohol/week: 12.0 standard drinks    Types: 12 Glasses of wine per week    Comment: occas.   Drug use: Never   Sexual activity: Not on file  Other Topics Concern   Not on file  Social History Narrative   Not on file   Social Determinants of Health   Financial Resource Strain: Not on file  Food Insecurity: Not on file  Transportation Needs: Not on file  Physical Activity: Not on file  Stress: Not on file  Social Connections: Not on file  Intimate Partner Violence: Not on file     Outpatient Medications Prior to Visit  Medication Sig Dispense Refill   calcium carbonate (TUMS - DOSED IN MG ELEMENTAL CALCIUM) 500 MG chewable tablet Chew 1-2 tablets by mouth at bedtime as needed for indigestion or heartburn.     clobetasol ointment (TEMOVATE) 4.09 % Apply 1 application topically daily as needed (psoriasis). 30 g 4   Cyanocobalamin (B-12 PO) Take 1 tablet by mouth daily.     EPINEPHrine (PRIMATENE MIST) 0.125 MG/ACT AERO Inhale 2 puffs into the lungs daily as needed (allergies).     fenofibrate (TRICOR) 145 MG tablet TAKE 1 TABLET BY MOUTH DAILY 30 tablet 3   losartan-hydrochlorothiazide (HYZAAR) 100-12.5 MG tablet TAKE 1 TABLET BY MOUTH DAILY 30 tablet 6   Multiple Vitamin (MULTIVITAMIN) tablet Take  1 tablet by mouth daily.     rosuvastatin (CRESTOR) 10 MG tablet Take 1 tablet (10 mg total) by mouth daily. 90 tablet 3   acetaminophen (TYLENOL) 650 MG CR tablet Take 1,300 mg by mouth every 8 (eight) hours as needed for pain. (Patient not taking: Reported on 02/21/2022)     loratadine (CLARITIN) 10 MG tablet Take 10 mg by mouth daily. (Patient not taking: Reported on 02/21/2022)     No facility-administered medications prior to visit.    No Known Allergies  ROS Review of Systems  Constitutional:  Positive for fatigue. Negative for activity change and appetite change.  Eyes:  Negative for discharge and itching.  Respiratory:  Negative for  chest tightness and shortness of breath.   Cardiovascular:  Negative for chest pain.  Gastrointestinal:  Negative for abdominal pain.  Genitourinary:  Negative for difficulty urinating.  Musculoskeletal:  Negative for arthralgias and back pain.  Skin:  Negative for color change.  Neurological:  Negative for headaches.  Psychiatric/Behavioral:  Negative for agitation, behavioral problems and confusion.      Objective:    Physical Exam Constitutional:      Appearance: Normal appearance. He is obese.  HENT:     Head: Normocephalic.     Right Ear: Tympanic membrane normal.     Left Ear: Tympanic membrane normal.     Nose: Nose normal.     Mouth/Throat:     Mouth: Mucous membranes are moist.  Eyes:     Pupils: Pupils are equal, round, and reactive to light.  Cardiovascular:     Rate and Rhythm: Normal rate and regular rhythm.  Pulmonary:     Effort: Pulmonary effort is normal.     Breath sounds: Normal breath sounds.  Abdominal:     General: Bowel sounds are normal.     Palpations: Abdomen is soft.  Musculoskeletal:        General: Normal range of motion.  Skin:    General: Skin is warm.     Capillary Refill: Capillary refill takes less than 2 seconds.  Neurological:     General: No focal deficit present.     Mental Status: He is alert and oriented to person, place, and time. Mental status is at baseline.  Psychiatric:        Mood and Affect: Mood normal.        Behavior: Behavior normal.        Thought Content: Thought content normal.        Judgment: Judgment normal.    BP (!) 168/97    Pulse 75    Ht 5' 6.5" (1.689 m)    Wt 209 lb 6.4 oz (95 kg)    BMI 33.29 kg/m  Wt Readings from Last 3 Encounters:  02/21/22 209 lb 6.4 oz (95 kg)  09/11/21 199 lb 9.6 oz (90.5 kg)  03/29/21 205 lb 6.4 oz (93.2 kg)     Health Maintenance Due  Topic Date Due   HIV Screening  Never done   Hepatitis C Screening  Never done   Zoster Vaccines- Shingrix (1 of 2) Never done    COVID-19 Vaccine (3 - Booster for Janssen series) 01/29/2021    There are no preventive care reminders to display for this patient.  Lab Results  Component Value Date   TSH 2.34 06/06/2020   Lab Results  Component Value Date   WBC 6.9 03/06/2021   HGB 16.3 03/06/2021   HCT 47.0 03/06/2021   MCV  96.7 03/06/2021   PLT 239 03/06/2021   Lab Results  Component Value Date   NA 137 03/15/2021   K 4.5 03/15/2021   CO2 21 03/15/2021   GLUCOSE 98 03/15/2021   BUN 22 03/15/2021   CREATININE 0.89 03/15/2021   BILITOT 0.4 03/15/2021   ALKPHOS 59 06/05/2020   AST 18 03/15/2021   ALT 37 03/15/2021   PROT 6.8 03/15/2021   ALBUMIN 4.3 06/05/2020   CALCIUM 9.6 03/15/2021   ANIONGAP 9 06/05/2020   Lab Results  Component Value Date   CHOL 276 (H) 03/06/2021   Lab Results  Component Value Date   HDL 44 03/06/2021   Lab Results  Component Value Date   LDLCALC 175 (H) 03/06/2021   Lab Results  Component Value Date   TRIG 335 (H) 03/06/2021   Lab Results  Component Value Date   CHOLHDL 6.3 (H) 03/06/2021   No results found for: HGBA1C    Assessment & Plan:   Problem List Items Addressed This Visit       Cardiovascular and Mediastinum   Essential hypertension - Primary    BP 168/97 in the office today. Pt blood pressure not controlled. Pt is taking losartan-HCTZ 100-12.5 mg once daily. Advised pt to limit the intake of sodium. Advised pt to eat heart healthy diet and perform regular physical activity. Advised pt to measure the BP at home and bring it to the next appointment. Added amlodipine 5 mg once daily.  Labs ordered.        Relevant Medications   amLODipine (NORVASC) 5 MG tablet   Other Relevant Orders   Basic metabolic panel   CBC with Differential/Platelet   Lipid panel     Respiratory   Asthma    Currently stable.        Other   Mixed hyperlipidemia    Lipid panel ordered. Advised pt to lose weight and eat healthy including more fruits,  vegetables, whole grains and protein. Continue rosuvastatin 10 mg and fenofibrate.         Relevant Medications   amLODipine (NORVASC) 5 MG tablet   Class 1 obesity with serious comorbidity and body mass index (BMI) of 33.0 to 33.9 in adult    BMI 33.29 Advised patient to lose weight.       Fatigue   Relevant Orders   TSH     Meds ordered this encounter  Medications   amLODipine (NORVASC) 5 MG tablet    Sig: Take 1 tablet (5 mg total) by mouth daily.    Dispense:  90 tablet    Refill:  1     Follow-up: Return in about 3 weeks (around 03/14/2022).    Theresia Lo, NP

## 2022-02-21 NOTE — Assessment & Plan Note (Addendum)
Currently stable.

## 2022-02-24 ENCOUNTER — Other Ambulatory Visit: Payer: Self-pay

## 2022-02-24 ENCOUNTER — Other Ambulatory Visit (INDEPENDENT_AMBULATORY_CARE_PROVIDER_SITE_OTHER): Payer: 59

## 2022-02-24 DIAGNOSIS — R5383 Other fatigue: Secondary | ICD-10-CM

## 2022-02-24 DIAGNOSIS — I10 Essential (primary) hypertension: Secondary | ICD-10-CM

## 2022-02-24 DIAGNOSIS — Z6833 Body mass index (BMI) 33.0-33.9, adult: Secondary | ICD-10-CM | POA: Diagnosis not present

## 2022-02-24 DIAGNOSIS — E669 Obesity, unspecified: Secondary | ICD-10-CM | POA: Diagnosis not present

## 2022-02-24 DIAGNOSIS — E782 Mixed hyperlipidemia: Secondary | ICD-10-CM | POA: Diagnosis not present

## 2022-02-25 LAB — CBC WITH DIFFERENTIAL/PLATELET
Absolute Monocytes: 509 cells/uL (ref 200–950)
Basophils Absolute: 42 cells/uL (ref 0–200)
Basophils Relative: 0.8 %
Eosinophils Absolute: 180 cells/uL (ref 15–500)
Eosinophils Relative: 3.4 %
HCT: 44.3 % (ref 38.5–50.0)
Hemoglobin: 14.7 g/dL (ref 13.2–17.1)
Lymphs Abs: 1728 cells/uL (ref 850–3900)
MCH: 31.8 pg (ref 27.0–33.0)
MCHC: 33.2 g/dL (ref 32.0–36.0)
MCV: 95.9 fL (ref 80.0–100.0)
MPV: 12 fL (ref 7.5–12.5)
Monocytes Relative: 9.6 %
Neutro Abs: 2841 cells/uL (ref 1500–7800)
Neutrophils Relative %: 53.6 %
Platelets: 208 10*3/uL (ref 140–400)
RBC: 4.62 10*6/uL (ref 4.20–5.80)
RDW: 11.8 % (ref 11.0–15.0)
Total Lymphocyte: 32.6 %
WBC: 5.3 10*3/uL (ref 3.8–10.8)

## 2022-02-25 LAB — LIPID PANEL
Cholesterol: 201 mg/dL — ABNORMAL HIGH (ref <200)
HDL: 47 mg/dL (ref >=40)
LDL Cholesterol (Calc): 128 mg/dL (calc) — ABNORMAL HIGH
Non-HDL Cholesterol (Calc): 154 mg/dL (calc) — ABNORMAL HIGH (ref <130)
Total CHOL/HDL Ratio: 4.3 (calc) (ref <5.0)
Triglycerides: 147 mg/dL (ref <150)

## 2022-02-25 LAB — COMPLETE METABOLIC PANEL WITH GFR
AG Ratio: 1.6 (calc) (ref 1.0–2.5)
ALT: 23 U/L (ref 9–46)
AST: 17 U/L (ref 10–35)
Albumin: 4.3 g/dL (ref 3.6–5.1)
Alkaline phosphatase (APISO): 41 U/L (ref 35–144)
BUN: 20 mg/dL (ref 7–25)
CO2: 23 mmol/L (ref 20–32)
Calcium: 9.2 mg/dL (ref 8.6–10.3)
Chloride: 102 mmol/L (ref 98–110)
Creat: 1.06 mg/dL (ref 0.70–1.35)
Globulin: 2.7 g/dL (calc) (ref 1.9–3.7)
Glucose, Bld: 99 mg/dL (ref 65–99)
Potassium: 4.8 mmol/L (ref 3.5–5.3)
Sodium: 137 mmol/L (ref 135–146)
Total Bilirubin: 0.5 mg/dL (ref 0.2–1.2)
Total Protein: 7 g/dL (ref 6.1–8.1)
eGFR: 80 mL/min/{1.73_m2} (ref >=60)

## 2022-02-25 LAB — TSH: TSH: 2.4 mIU/L (ref 0.40–4.50)

## 2022-02-25 LAB — PSA: PSA: 0.45 ng/mL (ref <=4.00)

## 2022-03-13 ENCOUNTER — Encounter: Payer: Self-pay | Admitting: Nurse Practitioner

## 2022-03-13 ENCOUNTER — Other Ambulatory Visit: Payer: Self-pay

## 2022-03-13 ENCOUNTER — Ambulatory Visit (INDEPENDENT_AMBULATORY_CARE_PROVIDER_SITE_OTHER): Payer: 59 | Admitting: Nurse Practitioner

## 2022-03-13 VITALS — BP 130/84 | HR 88 | Ht 66.5 in | Wt 205.9 lb

## 2022-03-13 DIAGNOSIS — R200 Anesthesia of skin: Secondary | ICD-10-CM | POA: Diagnosis not present

## 2022-03-13 DIAGNOSIS — E669 Obesity, unspecified: Secondary | ICD-10-CM | POA: Diagnosis not present

## 2022-03-13 DIAGNOSIS — Z6833 Body mass index (BMI) 33.0-33.9, adult: Secondary | ICD-10-CM | POA: Diagnosis not present

## 2022-03-13 DIAGNOSIS — I10 Essential (primary) hypertension: Secondary | ICD-10-CM | POA: Diagnosis not present

## 2022-03-13 DIAGNOSIS — J4520 Mild intermittent asthma, uncomplicated: Secondary | ICD-10-CM | POA: Diagnosis not present

## 2022-03-13 DIAGNOSIS — E782 Mixed hyperlipidemia: Secondary | ICD-10-CM | POA: Diagnosis not present

## 2022-03-13 NOTE — Assessment & Plan Note (Signed)
Lost 4 lb in 3 weeks. ?BMI 32.74 ?Continue watching the diet and follow a regular physical activity schedule. ?  ? ? ? ?

## 2022-03-13 NOTE — Assessment & Plan Note (Signed)
Stable at present  on medication. ?

## 2022-03-13 NOTE — Progress Notes (Signed)
? ?Established Patient Office Visit ? ?Subjective:  ?Patient ID: Nathaniel Roberts, male    DOB: 1961-06-16  Age: 61 y.o. MRN: [id number] ? ?CC:  ?Chief Complaint  ?Patient presents with  ? Hypertension  ?  Patient here for 3 week follow up   ? lab results  ? ? ? ?HPI ? ?Nathaniel Roberts presents for lab review and blood pressure follow up. His LDL is elevated. All other labs WNL. Patient complaints of intermittent numbness of this hand while sleeping. It is a chronic and would like to see the orthopedic for pinch nerve.  ? ? ?HPI  ? ?Past Medical History:  ?Diagnosis Date  ? Arthritis   ? ankles  ? Asthma   ? GERD (gastroesophageal reflux disease)   ? Hypertension   ? ? ?Past Surgical History:  ?Procedure Laterality Date  ? COLONOSCOPY WITH PROPOFOL N/A 02/19/2018  ? Procedure: COLONOSCOPY WITH PROPOFOL;  Surgeon: Lucilla Lame, MD;  Location: [place];  Service: Endoscopy;  Laterality: N/A;  ? CYST REMOVAL LEG Left   ? INCISION AND DRAINAGE ABSCESS    ? JOINT REPLACEMENT    ? KNEE GANGLION EXCISION Right   ? POLYPECTOMY  02/19/2018  ? Procedure: POLYPECTOMY;  Surgeon: Lucilla Lame, MD;  Location: [place];  Service: Endoscopy;;  ? TOTAL HIP ARTHROPLASTY Right 06/13/2020  ? Procedure: TOTAL HIP ARTHROPLASTY ANTERIOR APPROACH;  Surgeon: Rod Can, MD;  Location: [facility];  Service: Orthopedics;  Laterality: Right;  ? ? ?History reviewed. No pertinent family history. ? ?Social History  ? ?Socioeconomic History  ? Marital status: Single  ?  Spouse name: Not on file  ? Number of children: Not on file  ? Years of education: Not on file  ? Highest education level: Not on file  ?Occupational History  ? Not on file  ?Tobacco Use  ? Smoking status: Never  ? Smokeless tobacco: Never  ?Vaping Use  ? Vaping Use: Never used  ?Substance and Sexual Activity  ? Alcohol use: Yes  ?  Alcohol/week: 12.0 standard drinks  ?  Types: 12 Glasses of wine per week  ?  Comment: occas.  ? Drug use: Never  ? Sexual activity: Not on  file  ?Other Topics Concern  ? Not on file  ?Social History Narrative  ? Not on file  ? ?Social Determinants of Health  ? ?Financial Resource Strain: Not on file  ?Food Insecurity: Not on file  ?Transportation Needs: Not on file  ?Physical Activity: Not on file  ?Stress: Not on file  ?Social Connections: Not on file  ?Intimate Partner Violence: Not on file  ? ? ? ?Outpatient Medications Prior to Visit  ?Medication Sig Dispense Refill  ? amLODipine (NORVASC) 5 MG tablet Take 1 tablet (5 mg total) by mouth daily. 90 tablet 1  ? calcium carbonate (TUMS - DOSED IN MG ELEMENTAL CALCIUM) 500 MG chewable tablet Chew 1-2 tablets by mouth at bedtime as needed for indigestion or heartburn.    ? clobetasol ointment (TEMOVATE) 3.61 % Apply 1 application topically daily as needed (psoriasis). 30 g 4  ? Cyanocobalamin (B-12 PO) Take 1 tablet by mouth daily.    ? EPINEPHrine (PRIMATENE MIST) 0.125 MG/ACT AERO Inhale 2 puffs into the lungs daily as needed (allergies).    ? fenofibrate (TRICOR) 145 MG tablet TAKE 1 TABLET BY MOUTH DAILY 30 tablet 3  ? losartan-hydrochlorothiazide (HYZAAR) 100-12.5 MG tablet TAKE 1 TABLET BY MOUTH DAILY 30 tablet 6  ? Multiple  Vitamin (MULTIVITAMIN) tablet Take 1 tablet by mouth daily.    ? rosuvastatin (CRESTOR) 10 MG tablet Take 1 tablet (10 mg total) by mouth daily. 90 tablet 3  ? ?No facility-administered medications prior to visit.  ? ? ?No Known Allergies ? ?ROS ?Review of Systems  ?Constitutional:  Negative for activity change, appetite change and fatigue.  ?HENT: Negative.    ?Eyes: Negative.   ?Respiratory:  Negative for cough and shortness of breath.   ?Cardiovascular:  Negative for chest pain and palpitations.  ?Gastrointestinal:  Negative for abdominal pain and blood in stool.  ?Genitourinary:  Negative for dysuria and hematuria.  ?Musculoskeletal:  Positive for arthralgias.  ?Skin:  Negative for color change and rash.  ?Neurological:  Positive for numbness. Negative for speech  difficulty, light-headedness and headaches.  ?     Numbness left hand while sleeping.  ?Psychiatric/Behavioral:  Negative for agitation, behavioral problems and confusion.   ? ?  ?Objective:  ?  ?Physical Exam ?Constitutional:   ?   Appearance: Normal appearance. He is obese.  ?HENT:  ?   Head: Normocephalic.  ?   Right Ear: Tympanic membrane normal.  ?   Left Ear: Tympanic membrane normal.  ?   Nose: Nose normal.  ?   Mouth/Throat:  ?   Mouth: Mucous membranes are moist.  ?Eyes:  ?   Pupils: Pupils are equal, round, and reactive to light.  ?Cardiovascular:  ?   Rate and Rhythm: Normal rate and regular rhythm.  ?   Pulses: Normal pulses.  ?   Heart sounds: Normal heart sounds.  ?Pulmonary:  ?   Effort: Pulmonary effort is normal.  ?   Breath sounds: Normal breath sounds.  ?Abdominal:  ?   General: Bowel sounds are normal.  ?   Palpations: Abdomen is soft.  ?Musculoskeletal:     ?   General: No swelling or tenderness.  ?   Cervical back: Normal range of motion.  ?Skin: ?   General: Skin is warm.  ?   Capillary Refill: Capillary refill takes less than 2 seconds.  ?Neurological:  ?   General: No focal deficit present.  ?   Mental Status: He is alert and oriented to person, place, and time. Mental status is at baseline.  ?Psychiatric:     ?   Mood and Affect: Mood normal.     ?   Behavior: Behavior normal.     ?   Thought Content: Thought content normal.     ?   Judgment: Judgment normal.  ? ? ?BP 130/84   Pulse 88   Ht 5' 6.5" (1.689 m)   Wt 205 lb 14.4 oz (93.4 kg)   BMI 32.74 kg/m?  ?Wt Readings from Last 3 Encounters:  ?03/13/22 205 lb 14.4 oz (93.4 kg)  ?02/21/22 209 lb 6.4 oz (95 kg)  ?09/11/21 199 lb 9.6 oz (90.5 kg)  ? ? ? ?Health Maintenance Due  ?Topic Date Due  ? HIV Screening  Never done  ? Hepatitis C Screening  Never done  ? Zoster Vaccines- Shingrix (1 of 2) Never done  ? COVID-19 Vaccine (3 - Booster for Janssen series) 01/29/2021  ? ? ?There are no preventive care reminders to display for this  patient. ? ?Lab Results  ?Component Value Date  ? TSH 2.40 02/24/2022  ? ?Lab Results  ?Component Value Date  ? WBC 5.3 02/24/2022  ? HGB 14.7 02/24/2022  ? HCT 44.3 02/24/2022  ? MCV 95.9 02/24/2022  ?  PLT 208 02/24/2022  ? ?Lab Results  ?Component Value Date  ? NA 137 02/24/2022  ? K 4.8 02/24/2022  ? CO2 23 02/24/2022  ? GLUCOSE 99 02/24/2022  ? BUN 20 02/24/2022  ? CREATININE 1.06 02/24/2022  ? BILITOT 0.5 02/24/2022  ? ALKPHOS 59 06/05/2020  ? AST 17 02/24/2022  ? ALT 23 02/24/2022  ? PROT 7.0 02/24/2022  ? ALBUMIN 4.3 06/05/2020  ? CALCIUM 9.2 02/24/2022  ? ANIONGAP 9 06/05/2020  ? EGFR 80 02/24/2022  ? ?Lab Results  ?Component Value Date  ? CHOL 201 (H) 02/24/2022  ? ?Lab Results  ?Component Value Date  ? HDL 47 02/24/2022  ? ?Lab Results  ?Component Value Date  ? LDLCALC 128 (H) 02/24/2022  ? ?Lab Results  ?Component Value Date  ? TRIG 147 02/24/2022  ? ?Lab Results  ?Component Value Date  ? CHOLHDL 4.3 02/24/2022  ? ?No results found for: HGBA1C ? ?  ?Assessment & Plan:  ? ?Problem List Items Addressed This Visit   ? ?  ? Cardiovascular and Mediastinum  ? Essential hypertension - Primary  ?  BP stable 130/84 in the office today. ?Continue the current medication amlodipine and losartan HCTZ. ? ? ?  ?  ?  ? Respiratory  ? Asthma  ?  Stable at present  on medication. ?  ?  ?  ? Other  ? Mixed hyperlipidemia  ?  Elevated LDL 128 ?Advised pt to watch diet. ?Continue the current medication. ?  ?  ? Class 1 obesity with serious comorbidity and body mass index (BMI) of 33.0 to 33.9 in adult  ?  Lost 4 lb in 3 weeks. ?BMI 32.74 ?Continue watching the diet and follow a regular physical activity schedule. ?  ? ? ? ?  ?  ? Numbness of hand  ?  Patient would like to see the ortho. ?Referral sent to [facility] ?  ?  ? ? ? ?No orders of the defined types were placed in this encounter. ? ? ? ?Follow-up: No follow-ups on file.  ? ? ?Theresia Lo, NP ?

## 2022-03-13 NOTE — Assessment & Plan Note (Signed)
Elevated LDL 128 ?Advised pt to watch diet. ?Continue the current medication. ?

## 2022-03-13 NOTE — Assessment & Plan Note (Signed)
BP stable 130/84 in the office today. ?Continue the current medication amlodipine and losartan HCTZ. ? ? ?

## 2022-03-13 NOTE — Assessment & Plan Note (Signed)
Patient would like to see the ortho. ?Referral sent to [facility] ?

## 2022-04-15 ENCOUNTER — Other Ambulatory Visit: Payer: Self-pay | Admitting: Internal Medicine

## 2022-05-22 ENCOUNTER — Other Ambulatory Visit: Payer: Self-pay | Admitting: Nurse Practitioner

## 2022-05-22 DIAGNOSIS — I10 Essential (primary) hypertension: Secondary | ICD-10-CM

## 2022-06-06 ENCOUNTER — Other Ambulatory Visit: Payer: Self-pay

## 2022-06-06 MED ORDER — ROSUVASTATIN CALCIUM 10 MG PO TABS: 10.0000 mg | ORAL_TABLET | Freq: Every day | ORAL | 3 refills | Status: DC

## 2022-06-16 ENCOUNTER — Encounter: Payer: Self-pay | Admitting: Internal Medicine

## 2022-06-16 ENCOUNTER — Ambulatory Visit (INDEPENDENT_AMBULATORY_CARE_PROVIDER_SITE_OTHER): Payer: 59 | Admitting: Internal Medicine

## 2022-06-16 VITALS — BP 132/80 | HR 72 | Ht 66.5 in | Wt 209.2 lb

## 2022-06-16 DIAGNOSIS — J4520 Mild intermittent asthma, uncomplicated: Secondary | ICD-10-CM

## 2022-06-16 DIAGNOSIS — I10 Essential (primary) hypertension: Secondary | ICD-10-CM | POA: Diagnosis not present

## 2022-06-16 DIAGNOSIS — Z6833 Body mass index (BMI) 33.0-33.9, adult: Secondary | ICD-10-CM | POA: Diagnosis not present

## 2022-06-16 DIAGNOSIS — Z716 Tobacco abuse counseling: Secondary | ICD-10-CM | POA: Diagnosis not present

## 2022-06-16 DIAGNOSIS — E669 Obesity, unspecified: Secondary | ICD-10-CM | POA: Diagnosis not present

## 2022-06-16 DIAGNOSIS — E782 Mixed hyperlipidemia: Secondary | ICD-10-CM

## 2022-06-16 DIAGNOSIS — M1611 Unilateral primary osteoarthritis, right hip: Secondary | ICD-10-CM

## 2022-06-16 MED ORDER — CLOBETASOL PROPIONATE 0.05 % EX OINT
1.0000 | TOPICAL_OINTMENT | Freq: Every day | CUTANEOUS | 4 refills | Status: DC | PRN
Start: 2022-06-16 — End: 2023-03-12

## 2022-06-16 NOTE — Assessment & Plan Note (Signed)

## 2022-06-16 NOTE — Assessment & Plan Note (Signed)
-   I instructed the patient to stop smoking and provided them with smoking cessation materials.  - I informed the patient that smoking puts them at increased risk for cancer, COPD, hypertension, and more.  - Informed the patient to seek help if they begin to have trouble breathing, develop chest pain, start to cough up blood, feel faint, or pass out.  

## 2022-06-16 NOTE — Assessment & Plan Note (Signed)
Stable at the present time. 

## 2022-06-16 NOTE — Assessment & Plan Note (Signed)
Hypercholesterolemia  I advised the patient to follow Mediterranean diet This diet is rich in fruits vegetables and whole grain, and This diet is also rich in fish and lean meat Patient should also eat a handful of almonds or walnuts daily Recent heart study indicated that average follow-up on this kind of diet reduces the cardiovascular mortality by 50 to 70%== 

## 2022-06-16 NOTE — Progress Notes (Signed)
Established Patient Office Visit  Subjective:  Patient ID: DEMON VOLANTE, male    DOB: May 23, 1961  Age: 61 y.o. MRN: [id number]  CC:  Chief Complaint  Patient presents with   Follow-up    HPI  SHALAMAR CRAYS presents for   check up  Past Medical History:  Diagnosis Date   Arthritis    ankles   Asthma    GERD (gastroesophageal reflux disease)    Hypertension     Past Surgical History:  Procedure Laterality Date   COLONOSCOPY WITH PROPOFOL N/A 02/19/2018   Procedure: COLONOSCOPY WITH PROPOFOL;  Surgeon: Lucilla Lame, MD;  Location: [place];  Service: Endoscopy;  Laterality: N/A;   CYST REMOVAL LEG Left    INCISION AND DRAINAGE ABSCESS     JOINT REPLACEMENT     KNEE GANGLION EXCISION Right    POLYPECTOMY  02/19/2018   Procedure: POLYPECTOMY;  Surgeon: Lucilla Lame, MD;  Location: [place];  Service: Endoscopy;;   TOTAL HIP ARTHROPLASTY Right 06/13/2020   Procedure: TOTAL HIP ARTHROPLASTY ANTERIOR APPROACH;  Surgeon: Rod Can, MD;  Location: [facility];  Service: Orthopedics;  Laterality: Right;    History reviewed. No pertinent family history.  Social History   Socioeconomic History   Marital status: Single    Spouse name: Not on file   Number of children: Not on file   Years of education: Not on file   Highest education level: Not on file  Occupational History   Not on file  Tobacco Use   Smoking status: Never   Smokeless tobacco: Never  Vaping Use   Vaping Use: Never used  Substance and Sexual Activity   Alcohol use: Yes    Alcohol/week: 12.0 standard drinks of alcohol    Types: 12 Glasses of wine per week    Comment: occas.   Drug use: Never   Sexual activity: Not on file  Other Topics Concern   Not on file  Social History Narrative   Not on file   Social Determinants of Health   Financial Resource Strain: Not on file  Food Insecurity: Not on file  Transportation Needs: Not on file  Physical Activity: Not on file  Stress:  Not on file  Social Connections: Not on file  Intimate Partner Violence: Not on file     Current Outpatient Medications:    amLODipine (NORVASC) 5 MG tablet, TAKE 1 TABLET BY MOUTH DAILY, Disp: 90 tablet, Rfl: 1   calcium carbonate (TUMS - DOSED IN MG ELEMENTAL CALCIUM) 500 MG chewable tablet, Chew 1-2 tablets by mouth at bedtime as needed for indigestion or heartburn., Disp: , Rfl:    clobetasol ointment (TEMOVATE) 6.76 %, Apply 1 Application topically daily as needed (psoriasis)., Disp: 30 g, Rfl: 4   Cyanocobalamin (B-12 PO), Take 1 tablet by mouth daily., Disp: , Rfl:    EPINEPHrine (PRIMATENE MIST) 0.125 MG/ACT AERO, Inhale 2 puffs into the lungs daily as needed (allergies)., Disp: , Rfl:    fenofibrate (TRICOR) 145 MG tablet, TAKE 1 TABLET BY MOUTH DAILY, Disp: 30 tablet, Rfl: 3   losartan-hydrochlorothiazide (HYZAAR) 100-12.5 MG tablet, TAKE 1 TABLET BY MOUTH DAILY, Disp: 30 tablet, Rfl: 6   Multiple Vitamin (MULTIVITAMIN) tablet, Take 1 tablet by mouth daily., Disp: , Rfl:    rosuvastatin (CRESTOR) 10 MG tablet, Take 1 tablet (10 mg total) by mouth daily., Disp: 90 tablet, Rfl: 3   No Known Allergies  ROS Review of Systems  Constitutional: Negative.  HENT: Negative.    Eyes: Negative.   Respiratory: Negative.    Cardiovascular: Negative.   Gastrointestinal: Negative.   Endocrine: Negative.   Genitourinary: Negative.   Musculoskeletal: Negative.   Skin: Negative.   Allergic/Immunologic: Negative.   Neurological: Negative.   Hematological: Negative.   Psychiatric/Behavioral: Negative.    All other systems reviewed and are negative.     Objective:    Physical Exam Vitals reviewed.  Constitutional:      Appearance: Normal appearance.  HENT:     Mouth/Throat:     Mouth: Mucous membranes are moist.  Eyes:     Pupils: Pupils are equal, round, and reactive to light.  Neck:     Vascular: No carotid bruit.  Cardiovascular:     Rate and Rhythm: Normal rate and  regular rhythm.     Pulses: Normal pulses.     Heart sounds: Normal heart sounds.  Pulmonary:     Effort: Pulmonary effort is normal.     Breath sounds: Normal breath sounds.  Abdominal:     General: Bowel sounds are normal.     Palpations: Abdomen is soft. There is no hepatomegaly, splenomegaly or mass.     Tenderness: There is no abdominal tenderness.     Hernia: No hernia is present.  Musculoskeletal:     Cervical back: Neck supple.     Right lower leg: No edema.     Left lower leg: No edema.  Skin:    Findings: No rash.  Neurological:     Mental Status: He is alert and oriented to person, place, and time.     Motor: No weakness.  Psychiatric:        Mood and Affect: Mood normal.        Behavior: Behavior normal.     BP 132/80   Pulse 72   Ht 5' 6.5" (1.689 m)   Wt 209 lb 3.2 oz (94.9 kg)   BMI 33.26 kg/m  Wt Readings from Last 3 Encounters:  06/16/22 209 lb 3.2 oz (94.9 kg)  03/13/22 205 lb 14.4 oz (93.4 kg)  02/21/22 209 lb 6.4 oz (95 kg)     Health Maintenance Due  Topic Date Due   HIV Screening  Never done   Hepatitis C Screening  Never done   Zoster Vaccines- Shingrix (1 of 2) Never done   COVID-19 Vaccine (3 - Booster for Janssen series) 01/29/2021    There are no preventive care reminders to display for this patient.  Lab Results  Component Value Date   TSH 2.40 02/24/2022   Lab Results  Component Value Date   WBC 5.3 02/24/2022   HGB 14.7 02/24/2022   HCT 44.3 02/24/2022   MCV 95.9 02/24/2022   PLT 208 02/24/2022   Lab Results  Component Value Date   NA 137 02/24/2022   K 4.8 02/24/2022   CO2 23 02/24/2022   GLUCOSE 99 02/24/2022   BUN 20 02/24/2022   CREATININE 1.06 02/24/2022   BILITOT 0.5 02/24/2022   ALKPHOS 59 06/05/2020   AST 17 02/24/2022   ALT 23 02/24/2022   PROT 7.0 02/24/2022   ALBUMIN 4.3 06/05/2020   CALCIUM 9.2 02/24/2022   ANIONGAP 9 06/05/2020   EGFR 80 02/24/2022   Lab Results  Component Value Date   CHOL 201  (H) 02/24/2022   Lab Results  Component Value Date   HDL 47 02/24/2022   Lab Results  Component Value Date   LDLCALC 128 (H) 02/24/2022  Lab Results  Component Value Date   TRIG 147 02/24/2022   Lab Results  Component Value Date   CHOLHDL 4.3 02/24/2022   No results found for: "HGBA1C"    Assessment & Plan:   Problem List Items Addressed This Visit       Cardiovascular and Mediastinum   Essential hypertension - Primary    The following hypertensive lifestyle modification were recommended and discussed:  1. Limiting alcohol intake to less than 1 oz/day of ethanol:(24 oz of beer or 8 oz of wine or 2 oz of 100-proof whiskey). 2. Take baby ASA 81 mg daily. 3. Importance of regular aerobic exercise and losing weight. 4. Reduce dietary saturated fat and cholesterol intake for overall cardiovascular health. 5. Maintaining adequate dietary potassium, calcium, and magnesium intake. 6. Regular monitoring of the blood pressure. 7. Reduce sodium intake to less than 100 mmol/day (less than 2.3 gm of sodium or less than 6 gm of sodium choride)         Respiratory   Asthma    Asthma is under control        Musculoskeletal and Integument   Osteoarthritis of right hip (Chronic)    Stable at the present time        Other   Tobacco abuse counseling    - I instructed the patient to stop smoking and provided them with smoking cessation materials.  - I informed the patient that smoking puts them at increased risk for cancer, COPD, hypertension, and more.  - Informed the patient to seek help if they begin to have trouble breathing, develop chest pain, start to cough up blood, feel faint, or pass out.      Mixed hyperlipidemia    Hypercholesterolemia  I advised the patient to follow Mediterranean diet This diet is rich in fruits vegetables and whole grain, and This diet is also rich in fish and lean meat Patient should also eat a handful of almonds or walnuts daily Recent  heart study indicated that average follow-up on this kind of diet reduces the cardiovascular mortality by 50 to 70%==      Class 1 obesity with serious comorbidity and body mass index (BMI) of 33.0 to 33.9 in adult    - I encouraged the patient to lose weight.  - I educated them on making healthy dietary choices including eating more fruits and vegetables and less fried foods. - I encouraged the patient to exercise more, and educated on the benefits of exercise including weight loss, diabetes prevention, and hypertension prevention.   Dietary counseling with a registered dietician  Referral to a weight management support group (e.g. Weight Watchers, Overeaters Anonymous)  If your BMI is greater than 29 or you have gained more than 15 pounds you should work on weight loss.  Attend a healthy cooking class        Meds ordered this encounter  Medications   clobetasol ointment (TEMOVATE) 0.05 %    Sig: Apply 1 Application topically daily as needed (psoriasis).    Dispense:  30 g    Refill:  4    Follow-up: No follow-ups on file.    Cletis Athens, MD

## 2022-06-16 NOTE — Assessment & Plan Note (Signed)
Asthma is under control

## 2022-06-16 NOTE — Assessment & Plan Note (Signed)

## 2022-08-07 ENCOUNTER — Other Ambulatory Visit: Payer: Self-pay | Admitting: Internal Medicine

## 2022-09-18 ENCOUNTER — Ambulatory Visit (INDEPENDENT_AMBULATORY_CARE_PROVIDER_SITE_OTHER): Payer: 59 | Admitting: Nurse Practitioner

## 2022-09-18 ENCOUNTER — Encounter: Payer: Self-pay | Admitting: Nurse Practitioner

## 2022-09-18 VITALS — BP 135/79 | HR 69 | Ht 66.5 in | Wt 216.4 lb

## 2022-09-18 DIAGNOSIS — Z6834 Body mass index (BMI) 34.0-34.9, adult: Secondary | ICD-10-CM

## 2022-09-18 DIAGNOSIS — I10 Essential (primary) hypertension: Secondary | ICD-10-CM

## 2022-09-18 DIAGNOSIS — E669 Obesity, unspecified: Secondary | ICD-10-CM | POA: Diagnosis not present

## 2022-09-18 DIAGNOSIS — E782 Mixed hyperlipidemia: Secondary | ICD-10-CM | POA: Diagnosis not present

## 2022-09-18 DIAGNOSIS — J45909 Unspecified asthma, uncomplicated: Secondary | ICD-10-CM | POA: Diagnosis not present

## 2022-09-18 MED ORDER — LOSARTAN POTASSIUM-HCTZ 100-12.5 MG PO TABS: 1.0000 | ORAL_TABLET | Freq: Every day | ORAL | 3 refills | Status: DC

## 2022-09-18 MED ORDER — FENOFIBRATE 145 MG PO TABS: 145.0000 mg | ORAL_TABLET | Freq: Every day | ORAL | 3 refills | Status: DC

## 2022-09-18 MED ORDER — AMLODIPINE BESYLATE 5 MG PO TABS: 5.0000 mg | ORAL_TABLET | Freq: Every day | ORAL | 1 refills | Status: DC

## 2022-09-18 MED ORDER — ROSUVASTATIN CALCIUM 10 MG PO TABS
10.0000 mg | ORAL_TABLET | Freq: Every day | ORAL | 3 refills | Status: DC
Start: 2022-09-18 — End: 2023-01-08

## 2022-09-18 NOTE — Progress Notes (Signed)
Established Patient Office Visit  Subjective:  Patient ID: Nathaniel Roberts, male    DOB: 1961/01/05  Age: 61 y.o. MRN: [id number]  CC:  Chief Complaint  Patient presents with   Follow-up    Patient here today for BP follow up.      HPI  Nathaniel Roberts presents for routine follow up. He has a h/o hypertension, hyperlipidemia, GERD and asthma. He is taking amlodipine: losartan/HCTZ, fenofibrate and rosuvastatin.  He is taking primatene Mist inhaler OTC for asthma.  He states that prescription of albuterol inhaler cost him more than OTC inhaler.  HPI   Past Medical History:  Diagnosis Date   Arthritis    ankles   Asthma    GERD (gastroesophageal reflux disease)    Hyperlipidemia    Hypertension     Past Surgical History:  Procedure Laterality Date   COLONOSCOPY WITH PROPOFOL N/A 02/19/2018   Procedure: COLONOSCOPY WITH PROPOFOL;  Surgeon: Lucilla Lame, MD;  Location: [facility];  Service: Endoscopy;  Laterality: N/A;   CYST REMOVAL LEG Left    INCISION AND DRAINAGE ABSCESS     JOINT REPLACEMENT     KNEE GANGLION EXCISION Right    POLYPECTOMY  02/19/2018   Procedure: POLYPECTOMY;  Surgeon: Lucilla Lame, MD;  Location: [facility];  Service: Endoscopy;;   TOTAL HIP ARTHROPLASTY Right 06/13/2020   Procedure: TOTAL HIP ARTHROPLASTY ANTERIOR APPROACH;  Surgeon: Rod Can, MD;  Location: [facility];  Service: Orthopedics;  Laterality: Right;    History reviewed. No pertinent family history.  Social History   Socioeconomic History   Marital status: Single    Spouse name: Not on file   Number of children: Not on file   Years of education: Not on file   Highest education level: Not on file  Occupational History   Not on file  Tobacco Use   Smoking status: Never   Smokeless tobacco: Never  Vaping Use   Vaping Use: Never used  Substance and Sexual Activity   Alcohol use: Yes    Alcohol/week: 12.0 standard drinks of alcohol    Types: 12 Glasses of wine per  week    Comment: occas.   Drug use: Never   Sexual activity: Not on file  Other Topics Concern   Not on file  Social History Narrative   Not on file   Social Determinants of Health   Financial Resource Strain: Not on file  Food Insecurity: Not on file  Transportation Needs: Not on file  Physical Activity: Not on file  Stress: Not on file  Social Connections: Not on file  Intimate Partner Violence: Not on file     Outpatient Medications Prior to Visit  Medication Sig Dispense Refill   calcium carbonate (TUMS - DOSED IN MG ELEMENTAL CALCIUM) 500 MG chewable tablet Chew 1-2 tablets by mouth at bedtime as needed for indigestion or heartburn.     clobetasol ointment (TEMOVATE) 6.33 % Apply 1 Application topically daily as needed (psoriasis). 30 g 4   Cyanocobalamin (B-12 PO) Take 1 tablet by mouth daily.     EPINEPHrine (PRIMATENE MIST) 0.125 MG/ACT AERO Inhale 2 puffs into the lungs daily as needed (allergies).     Multiple Vitamin (MULTIVITAMIN) tablet Take 1 tablet by mouth daily.     amLODipine (NORVASC) 5 MG tablet TAKE 1 TABLET BY MOUTH DAILY 90 tablet 1   fenofibrate (TRICOR) 145 MG tablet TAKE 1 TABLET BY MOUTH DAILY 30 tablet 3   losartan-hydrochlorothiazide (  HYZAAR) 100-12.5 MG tablet TAKE 1 TABLET BY MOUTH DAILY 30 tablet 6   rosuvastatin (CRESTOR) 10 MG tablet Take 1 tablet (10 mg total) by mouth daily. 90 tablet 3   No facility-administered medications prior to visit.    No Known Allergies  ROS Review of Systems  Constitutional: Negative.   HENT: Negative.    Eyes: Negative.   Respiratory: Negative.    Cardiovascular: Negative.   Gastrointestinal: Negative.   Genitourinary: Negative.   Musculoskeletal: Negative.   Neurological: Negative.   Psychiatric/Behavioral: Negative.        Objective:    Physical Exam Constitutional:      Appearance: Normal appearance. He is obese.  HENT:     Head: Normocephalic.     Right Ear: Tympanic membrane normal.      Left Ear: Tympanic membrane normal.     Nose: Nose normal.     Mouth/Throat:     Mouth: Mucous membranes are moist.  Eyes:     Conjunctiva/sclera: Conjunctivae normal.     Pupils: Pupils are equal, round, and reactive to light.  Cardiovascular:     Rate and Rhythm: Normal rate and regular rhythm.     Pulses: Normal pulses.     Heart sounds: Normal heart sounds.  Pulmonary:     Effort: Pulmonary effort is normal.     Breath sounds: Normal breath sounds.  Abdominal:     General: Bowel sounds are normal.     Palpations: Abdomen is soft. There is no mass.     Tenderness: There is no abdominal tenderness.  Musculoskeletal:        General: Normal range of motion.  Skin:    General: Skin is warm.     Capillary Refill: Capillary refill takes less than 2 seconds.  Neurological:     General: No focal deficit present.     Mental Status: He is alert and oriented to person, place, and time. Mental status is at baseline.  Psychiatric:        Mood and Affect: Mood normal.        Behavior: Behavior normal.        Thought Content: Thought content normal.        Judgment: Judgment normal.     BP 135/79   Pulse 69   Ht 5' 6.5" (1.689 m)   Wt 216 lb 6.4 oz (98.2 kg)   BMI 34.40 kg/m  Wt Readings from Last 3 Encounters:  09/18/22 216 lb 6.4 oz (98.2 kg)  06/16/22 209 lb 3.2 oz (94.9 kg)  03/13/22 205 lb 14.4 oz (93.4 kg)     Health Maintenance  Topic Date Due   HIV Screening  Never done   Hepatitis C Screening  Never done   COVID-19 Vaccine (3 - Booster for Janssen series) 01/29/2021   Zoster Vaccines- Shingrix (1 of 2) 12/18/2022 (Originally 03/14/2011)   INFLUENZA VACCINE  03/29/2023 (Originally 07/29/2022)   COLONOSCOPY (Pts 45-43yr Insurance coverage will need to be confirmed)  02/19/2023   TETANUS/TDAP  09/12/2031   HPV VACCINES  Aged Out    There are no preventive care reminders to display for this patient.  Lab Results  Component Value Date   TSH 2.40 02/24/2022   Lab  Results  Component Value Date   WBC 5.3 02/24/2022   HGB 14.7 02/24/2022   HCT 44.3 02/24/2022   MCV 95.9 02/24/2022   PLT 208 02/24/2022   Lab Results  Component Value Date   NA 137 02/24/2022  K 4.8 02/24/2022   CO2 23 02/24/2022   GLUCOSE 99 02/24/2022   BUN 20 02/24/2022   CREATININE 1.06 02/24/2022   BILITOT 0.5 02/24/2022   ALKPHOS 59 06/05/2020   AST 17 02/24/2022   ALT 23 02/24/2022   PROT 7.0 02/24/2022   ALBUMIN 4.3 06/05/2020   CALCIUM 9.2 02/24/2022   ANIONGAP 9 06/05/2020   EGFR 80 02/24/2022   Lab Results  Component Value Date   CHOL 201 (H) 02/24/2022   Lab Results  Component Value Date   HDL 47 02/24/2022   Lab Results  Component Value Date   LDLCALC 128 (H) 02/24/2022   Lab Results  Component Value Date   TRIG 147 02/24/2022   Lab Results  Component Value Date   CHOLHDL 4.3 02/24/2022   No results found for: "HGBA1C"    Assessment & Plan:   Problem List Items Addressed This Visit       Cardiovascular and Mediastinum   Essential hypertension    His blood pressure 135/79 in the office today. Encouraged him to consume low-salt heart healthy diet. Advised patient to continue the current medication losartan/HCTZ and amlodipine.      Relevant Medications   amLODipine (NORVASC) 5 MG tablet   losartan-hydrochlorothiazide (HYZAAR) 100-12.5 MG tablet   fenofibrate (TRICOR) 145 MG tablet   rosuvastatin (CRESTOR) 10 MG tablet     Respiratory   Asthma - Primary    Patient refused to get the spirometry. He takes over-the-counter inhaler because of prescription of albuterol is expensive with his insurance.          Other   Mixed hyperlipidemia    Advised patient to continue taking statin therapy for cardiovascular risk reduction.      Relevant Medications   amLODipine (NORVASC) 5 MG tablet   losartan-hydrochlorothiazide (HYZAAR) 100-12.5 MG tablet   fenofibrate (TRICOR) 145 MG tablet   rosuvastatin (CRESTOR) 10 MG tablet   Class  1 obesity with serious comorbidity and body mass index (BMI) of 34.0 to 34.9 in adult    Body mass index is 34.4 kg/m. Advised pt to lose weight. Advised patient to avoid trans fat, fatty and fried food. Follow a regular physical activity schedule.            Meds ordered this encounter  Medications   amLODipine (NORVASC) 5 MG tablet    Sig: Take 1 tablet (5 mg total) by mouth daily.    Dispense:  90 tablet    Refill:  1    FOR FUTURE REFILLS IF ALLOWED   losartan-hydrochlorothiazide (HYZAAR) 100-12.5 MG tablet    Sig: Take 1 tablet by mouth daily.    Dispense:  90 tablet    Refill:  3   fenofibrate (TRICOR) 145 MG tablet    Sig: Take 1 tablet (145 mg total) by mouth daily.    Dispense:  90 tablet    Refill:  3   rosuvastatin (CRESTOR) 10 MG tablet    Sig: Take 1 tablet (10 mg total) by mouth daily.    Dispense:  90 tablet    Refill:  3     Follow-up: No follow-ups on file.    Theresia Lo, NP

## 2022-09-18 NOTE — Assessment & Plan Note (Signed)
Patient refused to get the spirometry. He takes over-the-counter inhaler because of prescription of albuterol is expensive with his insurance.

## 2022-09-18 NOTE — Assessment & Plan Note (Signed)
Advised patient to continue taking statin therapy for cardiovascular risk reduction.

## 2022-09-18 NOTE — Assessment & Plan Note (Signed)
Body mass index is 34.4 kg/m. Advised pt to lose weight. Advised patient to avoid trans fat, fatty and fried food. Follow a regular physical activity schedule.

## 2022-09-18 NOTE — Assessment & Plan Note (Signed)
His blood pressure 135/79 in the office today. Encouraged him to consume low-salt heart healthy diet. Advised patient to continue the current medication losartan/HCTZ and amlodipine.

## 2022-10-15 ENCOUNTER — Ambulatory Visit: Payer: 59 | Admitting: Internal Medicine

## 2023-01-08 ENCOUNTER — Encounter: Payer: Self-pay | Admitting: Nurse Practitioner

## 2023-01-08 ENCOUNTER — Other Ambulatory Visit: Payer: Self-pay

## 2023-01-08 ENCOUNTER — Telehealth: Payer: Self-pay

## 2023-01-08 ENCOUNTER — Ambulatory Visit: Payer: 59 | Admitting: Nurse Practitioner

## 2023-01-08 VITALS — BP 132/78 | HR 76 | Temp 98.5°F | Ht 66.0 in | Wt 211.6 lb

## 2023-01-08 DIAGNOSIS — I10 Essential (primary) hypertension: Secondary | ICD-10-CM

## 2023-01-08 DIAGNOSIS — Z1211 Encounter for screening for malignant neoplasm of colon: Secondary | ICD-10-CM

## 2023-01-08 DIAGNOSIS — Z23 Encounter for immunization: Secondary | ICD-10-CM

## 2023-01-08 DIAGNOSIS — Z86010 Personal history of colonic polyps: Secondary | ICD-10-CM

## 2023-01-08 DIAGNOSIS — J4520 Mild intermittent asthma, uncomplicated: Secondary | ICD-10-CM

## 2023-01-08 DIAGNOSIS — E782 Mixed hyperlipidemia: Secondary | ICD-10-CM

## 2023-01-08 DIAGNOSIS — D229 Melanocytic nevi, unspecified: Secondary | ICD-10-CM

## 2023-01-08 MED ORDER — FENOFIBRATE 145 MG PO TABS: 145.0000 mg | ORAL_TABLET | Freq: Every day | ORAL | 3 refills | Status: DC

## 2023-01-08 MED ORDER — LOSARTAN POTASSIUM-HCTZ 100-12.5 MG PO TABS: 1.0000 | ORAL_TABLET | Freq: Every day | ORAL | 3 refills | Status: DC

## 2023-01-08 MED ORDER — NA SULFATE-K SULFATE-MG SULF 17.5-3.13-1.6 GM/177ML PO SOLN: 1.0000 | Freq: Once | ORAL | 0 refills | Status: AC

## 2023-01-08 MED ORDER — ROSUVASTATIN CALCIUM 10 MG PO TABS: 10.0000 mg | ORAL_TABLET | Freq: Every day | ORAL | 3 refills | Status: DC

## 2023-01-08 MED ORDER — AMLODIPINE BESYLATE 5 MG PO TABS: 5.0000 mg | ORAL_TABLET | Freq: Every day | ORAL | 4 refills | Status: DC

## 2023-01-08 NOTE — Telephone Encounter (Signed)
Gastroenterology Pre-Procedure Review  Request Date: 03/30/23 Requesting Physician: Dr. Allen Norris  PATIENT REVIEW QUESTIONS: The patient responded to the following health history questions as indicated:    1. Are you having any GI issues? no 2. Do you have a personal history of Polyps? yes (last colonoscopy performed by Dr. Allen Norris 02/19/18) 3. Do you have a family history of Colon Cancer or Polyps? no 4. Diabetes Mellitus? no 5. Joint replacements in the past 12 months?no 6. Major health problems in the past 3 months?no 7. Any artificial heart valves, MVP, or defibrillator?no    MEDICATIONS & ALLERGIES:    Patient reports the following regarding taking any anticoagulation/antiplatelet therapy:   Plavix, Coumadin, Eliquis, Xarelto, Lovenox, Pradaxa, Brilinta, or Effient? no Aspirin? no  Patient confirms/reports the following medications:  Current Outpatient Medications  Medication Sig Dispense Refill   amLODipine (NORVASC) 5 MG tablet Take 1 tablet (5 mg total) by mouth daily. 90 tablet 4   calcium carbonate (TUMS - DOSED IN MG ELEMENTAL CALCIUM) 500 MG chewable tablet Chew 1-2 tablets by mouth at bedtime as needed for indigestion or heartburn.     clobetasol ointment (TEMOVATE) 8.32 % Apply 1 Application topically daily as needed (psoriasis). 30 g 4   Cyanocobalamin (B-12 PO) Take 1 tablet by mouth daily.     EPINEPHrine (PRIMATENE MIST) 0.125 MG/ACT AERO Inhale 2 puffs into the lungs daily as needed (allergies).     fenofibrate (TRICOR) 145 MG tablet Take 1 tablet (145 mg total) by mouth daily. 90 tablet 3   losartan-hydrochlorothiazide (HYZAAR) 100-12.5 MG tablet Take 1 tablet by mouth daily. 90 tablet 3   Multiple Vitamin (MULTIVITAMIN) tablet Take 1 tablet by mouth daily.     rosuvastatin (CRESTOR) 10 MG tablet Take 1 tablet (10 mg total) by mouth daily. 90 tablet 3   No current facility-administered medications for this visit.    Patient confirms/reports the following allergies:   No Known Allergies  No orders of the defined types were placed in this encounter.   AUTHORIZATION INFORMATION Primary Insurance: 1D#: Group #:  Secondary Insurance: 1D#: Group #:  SCHEDULE INFORMATION: Date: 03/30/23 Time: Location: [facility]

## 2023-01-08 NOTE — Progress Notes (Signed)
Established Patient Office Visit  Subjective:  Patient ID: Nathaniel Roberts, male    DOB: 07/01/1961  Age: 62 y.o. MRN: [id number]  CC:  Chief Complaint  Patient presents with   Medical Management of Chronic Issues     HPI  Nathaniel Roberts presents for routine follow up.  Patient states that he has  a mole on the right side of his chin that appeared few months ago.  It has not changed color or increase in size.  No other concerns at present.    HPI   Past Medical History:  Diagnosis Date   Arthritis    ankles   Asthma    GERD (gastroesophageal reflux disease)    Hyperlipidemia    Hypertension     Past Surgical History:  Procedure Laterality Date   COLONOSCOPY WITH PROPOFOL N/A 02/19/2018   Procedure: COLONOSCOPY WITH PROPOFOL;  Surgeon: Lucilla Lame, MD;  Location: [facility];  Service: Endoscopy;  Laterality: N/A;   CYST REMOVAL LEG Left    INCISION AND DRAINAGE ABSCESS     JOINT REPLACEMENT     KNEE GANGLION EXCISION Right    POLYPECTOMY  02/19/2018   Procedure: POLYPECTOMY;  Surgeon: Lucilla Lame, MD;  Location: [facility];  Service: Endoscopy;;   TOTAL HIP ARTHROPLASTY Right 06/13/2020   Procedure: TOTAL HIP ARTHROPLASTY ANTERIOR APPROACH;  Surgeon: Rod Can, MD;  Location: [facility];  Service: Orthopedics;  Laterality: Right;    History reviewed. No pertinent family history.  Social History   Socioeconomic History   Marital status: Single    Spouse name: Not on file   Number of children: Not on file   Years of education: Not on file   Highest education level: Not on file  Occupational History   Not on file  Tobacco Use   Smoking status: Never   Smokeless tobacco: Never  Vaping Use   Vaping Use: Never used  Substance and Sexual Activity   Alcohol use: Yes    Alcohol/week: 12.0 standard drinks of alcohol    Types: 12 Glasses of wine per week    Comment: occas.   Drug use: Never   Sexual activity: Not on file  Other Topics Concern    Not on file  Social History Narrative   Not on file   Social Determinants of Health   Financial Resource Strain: Not on file  Food Insecurity: Not on file  Transportation Needs: Not on file  Physical Activity: Not on file  Stress: Not on file  Social Connections: Not on file  Intimate Partner Violence: Not on file     Outpatient Medications Prior to Visit  Medication Sig Dispense Refill   calcium carbonate (TUMS - DOSED IN MG ELEMENTAL CALCIUM) 500 MG chewable tablet Chew 1-2 tablets by mouth at bedtime as needed for indigestion or heartburn.     clobetasol ointment (TEMOVATE) 7.82 % Apply 1 Application topically daily as needed (psoriasis). 30 g 4   Cyanocobalamin (B-12 PO) Take 1 tablet by mouth daily.     EPINEPHrine (PRIMATENE MIST) 0.125 MG/ACT AERO Inhale 2 puffs into the lungs daily as needed (allergies).     Multiple Vitamin (MULTIVITAMIN) tablet Take 1 tablet by mouth daily.     amLODipine (NORVASC) 5 MG tablet Take 1 tablet (5 mg total) by mouth daily. 90 tablet 1   fenofibrate (TRICOR) 145 MG tablet Take 1 tablet (145 mg total) by mouth daily. 90 tablet 3   losartan-hydrochlorothiazide (HYZAAR) 100-12.5 MG  tablet Take 1 tablet by mouth daily. 90 tablet 3   rosuvastatin (CRESTOR) 10 MG tablet Take 1 tablet (10 mg total) by mouth daily. 90 tablet 3   No facility-administered medications prior to visit.    No Known Allergies  ROS Review of Systems  Constitutional: Negative.   HENT: Negative.    Eyes: Negative.   Respiratory:  Negative for shortness of breath.   Cardiovascular:  Negative for chest pain and palpitations.  Gastrointestinal: Negative.   Genitourinary: Negative.   Musculoskeletal:  Positive for arthralgias.  Skin:        Hyperpigmentation on the chin  Neurological: Negative.   Psychiatric/Behavioral: Negative.        Objective:    Physical Exam Constitutional:      Appearance: Normal appearance. He is obese.  HENT:     Head: Normocephalic.      Right Ear: Tympanic membrane normal.     Mouth/Throat:     Mouth: Mucous membranes are moist.  Cardiovascular:     Pulses: Normal pulses.     Heart sounds: Normal heart sounds. No murmur heard.    No friction rub.  Pulmonary:     Effort: Pulmonary effort is normal.     Breath sounds: Normal breath sounds.  Abdominal:     General: Bowel sounds are normal.     Palpations: Abdomen is soft. There is no mass.     Hernia: No hernia is present.  Skin:    Comments: Hyperpigmentation on the chin and forehead  Neurological:     General: No focal deficit present.     Mental Status: He is alert and oriented to person, place, and time. Mental status is at baseline.  Psychiatric:        Mood and Affect: Mood normal.        Behavior: Behavior normal.        Thought Content: Thought content normal.        Judgment: Judgment normal.     BP 132/78   Pulse 76   Temp 98.5 F (36.9 C) (Oral)   Ht '5\' 6"'$  (1.676 m)   Wt 211 lb 9.6 oz (96 kg)   SpO2 96%   BMI 34.15 kg/m  Wt Readings from Last 3 Encounters:  01/08/23 211 lb 9.6 oz (96 kg)  09/18/22 216 lb 6.4 oz (98.2 kg)  06/16/22 209 lb 3.2 oz (94.9 kg)     Health Maintenance  Topic Date Due   HIV Screening  Never done   Hepatitis C Screening  Never done   Zoster Vaccines- Shingrix (1 of 2) Never done   COVID-19 Vaccine (3 - 2023-24 season) 08/29/2022   INFLUENZA VACCINE  03/29/2023 (Originally 07/29/2022)   COLONOSCOPY (Pts 45-55yr Insurance coverage will need to be confirmed)  02/19/2023   DTaP/Tdap/Td (2 - Td or Tdap) 09/12/2031   HPV VACCINES  Aged Out    There are no preventive care reminders to display for this patient.  Lab Results  Component Value Date   TSH 2.40 02/24/2022   Lab Results  Component Value Date   WBC 5.3 02/24/2022   HGB 14.7 02/24/2022   HCT 44.3 02/24/2022   MCV 95.9 02/24/2022   PLT 208 02/24/2022   Lab Results  Component Value Date   NA 137 02/24/2022   K 4.8 02/24/2022   CO2 23  02/24/2022   GLUCOSE 99 02/24/2022   BUN 20 02/24/2022   CREATININE 1.06 02/24/2022   BILITOT 0.5 02/24/2022  ALKPHOS 59 06/05/2020   AST 17 02/24/2022   ALT 23 02/24/2022   PROT 7.0 02/24/2022   ALBUMIN 4.3 06/05/2020   CALCIUM 9.2 02/24/2022   ANIONGAP 9 06/05/2020   EGFR 80 02/24/2022   Lab Results  Component Value Date   CHOL 201 (H) 02/24/2022   Lab Results  Component Value Date   HDL 47 02/24/2022   Lab Results  Component Value Date   LDLCALC 128 (H) 02/24/2022   Lab Results  Component Value Date   TRIG 147 02/24/2022   Lab Results  Component Value Date   CHOLHDL 4.3 02/24/2022   No results found for: "HGBA1C"    Assessment & Plan:   Problem List Items Addressed This Visit       Cardiovascular and Mediastinum   Essential hypertension - Primary    Patient BP  Vitals:   01/08/23 1004  BP: 132/78    in the office today. Advised pt to follow a low sodium and heart healthy diet. Continue losartan/HCTZ and amlodipine daily.        Relevant Medications   amLODipine (NORVASC) 5 MG tablet   fenofibrate (TRICOR) 145 MG tablet   losartan-hydrochlorothiazide (HYZAAR) 100-12.5 MG tablet   rosuvastatin (CRESTOR) 10 MG tablet     Respiratory   Mild intermittent asthma without complication    Stable at present.        Other   Mixed hyperlipidemia    Continue statin therapy and fenofibrate for cardiovascular risk reduction. Encourage patient to consume heart healthy and balanced diet.       Relevant Medications   amLODipine (NORVASC) 5 MG tablet   fenofibrate (TRICOR) 145 MG tablet   losartan-hydrochlorothiazide (HYZAAR) 100-12.5 MG tablet   rosuvastatin (CRESTOR) 10 MG tablet   Multiple nevi    Denies pain or itching, color change associated with the morning. Advised patient to monitor for any change in color, shape and size. Will continue to monitor if needed would refer him to the dermatologist.      Other Visit Diagnoses     Encounter  for screening colonoscopy       Relevant Orders   Ambulatory referral to Gastroenterology   Need for shingles vaccine       Relevant Orders   Varicella-zoster vaccine subcutaneous (Completed)        Meds ordered this encounter  Medications   amLODipine (NORVASC) 5 MG tablet    Sig: Take 1 tablet (5 mg total) by mouth daily.    Dispense:  90 tablet    Refill:  4    FOR FUTURE REFILLS IF ALLOWED   fenofibrate (TRICOR) 145 MG tablet    Sig: Take 1 tablet (145 mg total) by mouth daily.    Dispense:  90 tablet    Refill:  3   losartan-hydrochlorothiazide (HYZAAR) 100-12.5 MG tablet    Sig: Take 1 tablet by mouth daily.    Dispense:  90 tablet    Refill:  3   rosuvastatin (CRESTOR) 10 MG tablet    Sig: Take 1 tablet (10 mg total) by mouth daily.    Dispense:  90 tablet    Refill:  3     Follow-up: Return in about 2 months (around 03/09/2023) for annual physical.    Theresia Lo, NP

## 2023-01-14 ENCOUNTER — Encounter: Payer: Self-pay | Admitting: Nurse Practitioner

## 2023-01-14 DIAGNOSIS — D229 Melanocytic nevi, unspecified: Secondary | ICD-10-CM | POA: Insufficient documentation

## 2023-01-14 NOTE — Assessment & Plan Note (Signed)
Denies pain or itching, color change associated with the morning. Advised patient to monitor for any change in color, shape and size. Will continue to monitor if needed would refer him to the dermatologist.

## 2023-01-14 NOTE — Assessment & Plan Note (Signed)
Continue statin therapy and fenofibrate for cardiovascular risk reduction. Encourage patient to consume heart healthy and balanced diet.

## 2023-01-14 NOTE — Assessment & Plan Note (Signed)
Stable at present.   

## 2023-01-14 NOTE — Assessment & Plan Note (Signed)
Patient BP  Vitals:   01/08/23 1004  BP: 132/78    in the office today. Advised pt to follow a low sodium and heart healthy diet. Continue losartan/HCTZ and amlodipine daily.

## 2023-03-05 ENCOUNTER — Other Ambulatory Visit: Payer: Self-pay | Admitting: Nurse Practitioner

## 2023-03-05 ENCOUNTER — Telehealth: Payer: Self-pay | Admitting: Nurse Practitioner

## 2023-03-05 DIAGNOSIS — Z0000 Encounter for general adult medical examination without abnormal findings: Secondary | ICD-10-CM

## 2023-03-05 NOTE — Telephone Encounter (Signed)
Order has been placed.

## 2023-03-05 NOTE — Telephone Encounter (Signed)
Patient has a lab appt 03/09/2023, there are No orders in.

## 2023-03-09 ENCOUNTER — Other Ambulatory Visit (INDEPENDENT_AMBULATORY_CARE_PROVIDER_SITE_OTHER): Payer: 59

## 2023-03-09 DIAGNOSIS — Z125 Encounter for screening for malignant neoplasm of prostate: Secondary | ICD-10-CM

## 2023-03-09 DIAGNOSIS — Z0000 Encounter for general adult medical examination without abnormal findings: Secondary | ICD-10-CM | POA: Diagnosis not present

## 2023-03-09 LAB — COMPREHENSIVE METABOLIC PANEL
ALT: 24 U/L (ref 0–53)
AST: 15 U/L (ref 0–37)
Albumin: 4.1 g/dL (ref 3.5–5.2)
Alkaline Phosphatase: 37 U/L — ABNORMAL LOW (ref 39–117)
BUN: 29 mg/dL — ABNORMAL HIGH (ref 6–23)
CO2: 25 mEq/L (ref 19–32)
Calcium: 9.6 mg/dL (ref 8.4–10.5)
Chloride: 103 mEq/L (ref 96–112)
Creatinine, Ser: 1.01 mg/dL (ref 0.40–1.50)
GFR: 80 mL/min (ref >60.00)
Glucose, Bld: 102 mg/dL — ABNORMAL HIGH (ref 70–99)
Potassium: 4 mEq/L (ref 3.5–5.1)
Sodium: 137 mEq/L (ref 135–145)
Total Bilirubin: 0.4 mg/dL (ref 0.2–1.2)
Total Protein: 6.7 g/dL (ref 6.0–8.3)

## 2023-03-09 LAB — CBC WITH DIFFERENTIAL/PLATELET
Basophils Absolute: 0 10*3/uL (ref 0.0–0.1)
Basophils Relative: 0.7 % (ref 0.0–3.0)
Eosinophils Absolute: 0.2 10*3/uL (ref 0.0–0.7)
Eosinophils Relative: 4.1 % (ref 0.0–5.0)
HCT: 44.5 % (ref 39.0–52.0)
Hemoglobin: 15.2 g/dL (ref 13.0–17.0)
Lymphocytes Relative: 35.8 % (ref 12.0–46.0)
Lymphs Abs: 1.9 10*3/uL (ref 0.7–4.0)
MCHC: 34.2 g/dL (ref 30.0–36.0)
MCV: 94 fl (ref 78.0–100.0)
Monocytes Absolute: 0.5 10*3/uL (ref 0.1–1.0)
Monocytes Relative: 9.1 % (ref 3.0–12.0)
Neutro Abs: 2.6 10*3/uL (ref 1.4–7.7)
Neutrophils Relative %: 50.3 % (ref 43.0–77.0)
Platelets: 208 10*3/uL (ref 150.0–400.0)
RBC: 4.74 Mil/uL (ref 4.22–5.81)
RDW: 12.7 % (ref 11.5–15.5)
WBC: 5.2 10*3/uL (ref 4.0–10.5)

## 2023-03-09 LAB — LIPID PANEL
Cholesterol: 182 mg/dL (ref 0–200)
HDL: 38.8 mg/dL — ABNORMAL LOW (ref >39.00)
LDL Cholesterol: 114 mg/dL — ABNORMAL HIGH (ref 0–99)
NonHDL: 143.36
Total CHOL/HDL Ratio: 5
Triglycerides: 147 mg/dL (ref 0.0–149.0)
VLDL: 29.4 mg/dL (ref 0.0–40.0)

## 2023-03-09 LAB — TSH: TSH: 2.47 u[IU]/mL (ref 0.35–5.50)

## 2023-03-09 LAB — PSA: PSA: 0.52 ng/mL (ref 0.10–4.00)

## 2023-03-09 NOTE — Addendum Note (Signed)
Addended by: Leeanne Rio on: 03/09/2023 08:09 AM   Modules accepted: Orders

## 2023-03-12 ENCOUNTER — Encounter: Payer: 59 | Admitting: Nurse Practitioner

## 2023-03-12 ENCOUNTER — Ambulatory Visit (INDEPENDENT_AMBULATORY_CARE_PROVIDER_SITE_OTHER): Payer: 59 | Admitting: Nurse Practitioner

## 2023-03-12 ENCOUNTER — Encounter: Payer: Self-pay | Admitting: Nurse Practitioner

## 2023-03-12 VITALS — BP 110/76 | HR 74 | Temp 98.0°F | Ht 66.0 in | Wt 209.2 lb

## 2023-03-12 DIAGNOSIS — Z0000 Encounter for general adult medical examination without abnormal findings: Secondary | ICD-10-CM | POA: Diagnosis not present

## 2023-03-12 DIAGNOSIS — G8929 Other chronic pain: Secondary | ICD-10-CM | POA: Insufficient documentation

## 2023-03-12 DIAGNOSIS — M25571 Pain in right ankle and joints of right foot: Secondary | ICD-10-CM | POA: Diagnosis not present

## 2023-03-12 MED ORDER — CLOBETASOL PROPIONATE 0.05 % EX OINT: 1.0000 | TOPICAL_OINTMENT | Freq: Every day | CUTANEOUS | 4 refills | Status: DC | PRN

## 2023-03-12 NOTE — Progress Notes (Signed)
Complete physical exam  Patient: Nathaniel Roberts   DOB: 1961/06/13   62 y.o. Male  MRN: [id number]  Subjective:    Chief Complaint  Patient presents with   Annual Exam    Right ankle pain level 7 is getting worse    Nathaniel Roberts is a 62 y.o. male who presents today for a complete physical exam. He reports consuming a general diet. Gym/ health club routine includes treadmill. He generally feels fairly well. He reports sleeping fairly well with melatonin.  He does have additional problems to discuss today. He has chronic right ankle pain and recently hurting more. He is not taking anything for it.   UTD: vaccine Colonoscopy: due   Most recent fall risk assessment:    03/12/2023    1:40 PM  [place] in the past year? 1  Number falls in past yr: 1  Injury with Fall? 0  Risk for fall due to : History of fall(s)  Follow up Falls evaluation completed     Most recent depression screenings:    03/12/2023    1:40 PM 01/08/2023   10:05 AM  PHQ 2/9 Scores  PHQ - 2 Score 0 0  PHQ- 9 Score 2     Patient Care Team: Theresia Lo, NP as PCP - General (Nurse Practitioner)   Outpatient Medications Prior to Visit  Medication Sig   amLODipine (NORVASC) 5 MG tablet Take 1 tablet (5 mg total) by mouth daily.   calcium carbonate (TUMS - DOSED IN MG ELEMENTAL CALCIUM) 500 MG chewable tablet Chew 1-2 tablets by mouth at bedtime as needed for indigestion or heartburn.   Cyanocobalamin (B-12 PO) Take 1 tablet by mouth daily.   EPINEPHrine (PRIMATENE MIST) 0.125 MG/ACT AERO Inhale 2 puffs into the lungs daily as needed (allergies).   fenofibrate (TRICOR) 145 MG tablet Take 1 tablet (145 mg total) by mouth daily.   losartan-hydrochlorothiazide (HYZAAR) 100-12.5 MG tablet Take 1 tablet by mouth daily.   Multiple Vitamin (MULTIVITAMIN) tablet Take 1 tablet by mouth daily.   rosuvastatin (CRESTOR) 10 MG tablet Take 1 tablet (10 mg total) by mouth daily.   [DISCONTINUED] clobetasol  ointment (TEMOVATE) AB-123456789 % Apply 1 Application topically daily as needed (psoriasis).   No facility-administered medications prior to visit.    Review of Systems  Constitutional: Negative.   HENT: Negative.    Eyes: Negative.   Respiratory:  Negative for cough and shortness of breath.   Cardiovascular: Negative.  Negative for palpitations and leg swelling.  Gastrointestinal: Negative.   Genitourinary: Negative.   Musculoskeletal:        Right ankle pain  Skin:        Hyperpigmentation to back  Neurological: Negative.   Psychiatric/Behavioral: Negative.        Objective:     BP 110/76   Pulse 74   Temp 98 F (36.7 C)   Ht '5\' 6"'$  (1.676 m)   Wt 209 lb 3.2 oz (94.9 kg)   SpO2 97%   BMI 33.77 kg/m  BP Readings from Last 3 Encounters:  03/12/23 110/76  01/08/23 132/78  09/18/22 135/79   Wt Readings from Last 3 Encounters:  03/12/23 209 lb 3.2 oz (94.9 kg)  01/08/23 211 lb 9.6 oz (96 kg)  09/18/22 216 lb 6.4 oz (98.2 kg)      Physical Exam Constitutional:      Appearance: Normal appearance.  HENT:     Head: Normocephalic.  Right Ear: Tympanic membrane normal.     Left Ear: Tympanic membrane normal.     Mouth/Throat:     Mouth: Mucous membranes are moist.  Eyes:     Extraocular Movements: Extraocular movements intact.     Conjunctiva/sclera: Conjunctivae normal.     Pupils: Pupils are equal, round, and reactive to light.  Neck:     Thyroid: No thyroid mass or thyroid tenderness.  Cardiovascular:     Rate and Rhythm: Normal rate and regular rhythm.     Pulses: Normal pulses.     Heart sounds: Normal heart sounds. No murmur heard. Pulmonary:     Effort: Pulmonary effort is normal.     Breath sounds: Normal breath sounds.  Abdominal:     General: Bowel sounds are normal. There is distension.     Palpations: Abdomen is soft. There is no mass.     Tenderness: There is no abdominal tenderness. There is no rebound.  Musculoskeletal:        General: No  swelling.     Cervical back: Neck supple. No tenderness.     Right lower leg: No edema.     Left lower leg: No edema.     Right ankle: Deformity present. No tenderness.     Left ankle: No deformity.  Skin:    General: Skin is warm.     Findings: No bruising, erythema or rash.     Comments: Hyperpigmentation (moles) to the face and back (1 right and 2 left)  Neurological:     General: No focal deficit present.     Mental Status: He is alert and oriented to person, place, and time. Mental status is at baseline.  Psychiatric:        Mood and Affect: Mood normal.        Behavior: Behavior normal.        Thought Content: Thought content normal.        Judgment: Judgment normal.          Assessment & Plan:    Routine Health Maintenance and Physical Exam  Immunization History  Administered Date(s) Administered   Janssen (J&J) SARS-COV-2 Vaccination 05/18/2020   Moderna Sars-Covid-2 Vaccination 12/04/2020   Tdap 09/11/2021   Zoster, Live 01/08/2023    Health Maintenance  Topic Date Due   HIV Screening  Never done   Hepatitis C Screening  Never done   Zoster Vaccines- Shingrix (1 of 2) Never done   COLONOSCOPY (Pts 45-91yr Insurance coverage will need to be confirmed)  02/19/2023   COVID-19 Vaccine (3 - 2023-24 season) 03/28/2023 (Originally 08/29/2022)   INFLUENZA VACCINE  03/29/2023 (Originally 07/29/2022)   DTaP/Tdap/Td (2 - Td or Tdap) 09/12/2031   HPV VACCINES  Aged Out     Annual physical exam Assessment & Plan: Discussed health benefits of physical activity, and encouraged him to engage in regular exercise appropriate for his age and condition.   Chronic pain of right ankle Assessment & Plan: Advised to apply Voltaren gel TID.    Other orders -     Clobetasol Propionate; Apply 1 Application topically daily as needed (psoriasis).  Dispense: 30 g; Refill: 4    Return in about 6 months (around 09/12/2023) for chronic management.     CTheresia Lo NP

## 2023-03-12 NOTE — Assessment & Plan Note (Signed)
Advised to apply Voltaren gel TID.

## 2023-03-12 NOTE — Patient Instructions (Signed)
Voltaren gel to the ankle. Routine care. Medication refilled

## 2023-03-12 NOTE — Assessment & Plan Note (Addendum)
Discussed health benefits of physical activity, lab result and encouraged him to engage in regular exercise appropriate for his age and condition. Advised to see an eye doctor and dentist annually.  Colonoscopy in April, 2024

## 2023-03-18 ENCOUNTER — Other Ambulatory Visit: Payer: Self-pay

## 2023-03-18 ENCOUNTER — Encounter: Payer: Self-pay | Admitting: Gastroenterology

## 2023-03-30 ENCOUNTER — Ambulatory Visit
Admission: RE | Admit: 2023-03-30 | Discharge: 2023-03-30 | Disposition: A | Discharge status: Home / Self Care | Payer: 59 | Attending: Gastroenterology | Admitting: Gastroenterology

## 2023-03-30 ENCOUNTER — Ambulatory Visit: Payer: 59 | Admitting: Anesthesiology

## 2023-03-30 ENCOUNTER — Other Ambulatory Visit: Payer: Self-pay

## 2023-03-30 ENCOUNTER — Encounter
Admission: RE | Disposition: A | Discharge status: Home / Self Care | Payer: Self-pay | Source: Home / Self Care | Attending: Gastroenterology

## 2023-03-30 ENCOUNTER — Encounter: Payer: Self-pay | Admitting: Gastroenterology

## 2023-03-30 DIAGNOSIS — D123 Benign neoplasm of transverse colon: Secondary | ICD-10-CM | POA: Insufficient documentation

## 2023-03-30 DIAGNOSIS — Z789 Other specified health status: Secondary | ICD-10-CM | POA: Diagnosis not present

## 2023-03-30 DIAGNOSIS — Z1211 Encounter for screening for malignant neoplasm of colon: Secondary | ICD-10-CM | POA: Insufficient documentation

## 2023-03-30 DIAGNOSIS — I10 Essential (primary) hypertension: Secondary | ICD-10-CM | POA: Insufficient documentation

## 2023-03-30 DIAGNOSIS — Z860100 Personal history of colon polyps, unspecified: Secondary | ICD-10-CM

## 2023-03-30 DIAGNOSIS — D124 Benign neoplasm of descending colon: Secondary | ICD-10-CM | POA: Diagnosis not present

## 2023-03-30 DIAGNOSIS — Z0000 Encounter for general adult medical examination without abnormal findings: Secondary | ICD-10-CM

## 2023-03-30 DIAGNOSIS — K640 First degree hemorrhoids: Secondary | ICD-10-CM | POA: Diagnosis not present

## 2023-03-30 DIAGNOSIS — K219 Gastro-esophageal reflux disease without esophagitis: Secondary | ICD-10-CM | POA: Insufficient documentation

## 2023-03-30 DIAGNOSIS — D126 Benign neoplasm of colon, unspecified: Secondary | ICD-10-CM | POA: Diagnosis not present

## 2023-03-30 DIAGNOSIS — Z09 Encounter for follow-up examination after completed treatment for conditions other than malignant neoplasm: Secondary | ICD-10-CM | POA: Diagnosis not present

## 2023-03-30 DIAGNOSIS — K635 Polyp of colon: Secondary | ICD-10-CM | POA: Diagnosis not present

## 2023-03-30 DIAGNOSIS — Z79899 Other long term (current) drug therapy: Secondary | ICD-10-CM | POA: Diagnosis not present

## 2023-03-30 HISTORY — PX: COLONOSCOPY WITH PROPOFOL: SHX5780

## 2023-03-30 SURGERY — COLONOSCOPY WITH PROPOFOL
Anesthesia: General | Site: Rectum

## 2023-03-30 MED ORDER — PROPOFOL 10 MG/ML IV BOLUS
INTRAVENOUS | Status: DC | PRN
  Administered 2023-03-30: 50 mg via INTRAVENOUS
  Administered 2023-03-30: 25 mg via INTRAVENOUS
  Administered 2023-03-30: 100 mg via INTRAVENOUS

## 2023-03-30 MED ORDER — SODIUM CHLORIDE 0.9 % IV SOLN: INTRAVENOUS | Status: DC

## 2023-03-30 MED ORDER — LACTATED RINGERS IV SOLN: INTRAVENOUS | Status: DC

## 2023-03-30 MED ORDER — STERILE WATER FOR IRRIGATION IR SOLN
Status: DC | PRN
  Administered 2023-03-30: 1000 mL

## 2023-03-30 MED ORDER — STERILE WATER FOR IRRIGATION IR SOLN: Status: DC | PRN

## 2023-03-30 SURGICAL SUPPLY — 21 items

## 2023-03-30 NOTE — Addendum Note (Signed)
Addendum  created 03/30/23 1411 by Helayne Seminole, MD   Attestation recorded in [place], [place] filed

## 2023-03-30 NOTE — Op Note (Signed)
[facility] Gastroenterology Patient Name: Nathaniel Roberts Procedure Date: 03/30/2023 9:39 AM MRN: [id number] Account #: [id number] Date of Birth: May 07, 1961 Admit Type: Outpatient Age: 62 Room: [facility] OR ROOM 01 Gender: Male Note Status: Finalized Instrument Name: [id number] Procedure:             Colonoscopy Indications:           High risk colon cancer surveillance: Personal history                         of colonic polyps Providers:             Lucilla Lame MD, MD Referring MD:          Theresia Lo (Referring MD) Medicines:             Propofol per Anesthesia Complications:         No immediate complications. Procedure:             Pre-Anesthesia Assessment:                        - Prior to the procedure, a History and Physical was                         performed, and patient medications and allergies were                         reviewed. The patient's tolerance of previous                         anesthesia was also reviewed. The risks and benefits                         of the procedure and the sedation options and risks                         were discussed with the patient. All questions were                         answered, and informed consent was obtained. Prior                         Anticoagulants: The patient has taken no anticoagulant                         or antiplatelet agents. ASA Grade Assessment: II - A                         patient with mild systemic disease. After reviewing                         the risks and benefits, the patient was deemed in                         satisfactory condition to undergo the procedure.                        After obtaining informed consent, the colonoscope was  passed under direct vision. Throughout the procedure,                         the patient's blood pressure, pulse, and oxygen                         saturations were monitored continuously. The                          Colonoscope was introduced through the anus and                         advanced to the the cecum, identified by appendiceal                         orifice and ileocecal valve. The colonoscopy was                         performed without difficulty. The patient tolerated                         the procedure well. The quality of the bowel                         preparation was excellent. Findings:      The perianal and digital rectal examinations were normal.      A 4 mm polyp was found in the transverse colon. The polyp was sessile.       The polyp was removed with a cold snare. Resection and retrieval were       complete.      Two sessile polyps were found in the descending colon. The polyps were 4       to 6 mm in size. These polyps were removed with a cold snare. Resection       and retrieval were complete.      Non-bleeding internal hemorrhoids were found during retroflexion. The       hemorrhoids were Grade I (internal hemorrhoids that do not prolapse).      Multiple small-mouthed diverticula were found in the sigmoid colon. Impression:            - One 4 mm polyp in the transverse colon, removed with                         a cold snare. Resected and retrieved.                        - Two 4 to 6 mm polyps in the descending colon,                         removed with a cold snare. Resected and retrieved.                        - Non-bleeding internal hemorrhoids.                        - Diverticulosis in the sigmoid colon. Recommendation:        - Discharge patient to home.                        -  Resume previous diet.                        - Continue present medications.                        - Await pathology results.                        - Repeat colonoscopy in 5 years for surveillance. Procedure Code(s):     --- Professional ---                        [phone], Colonoscopy, flexible; with removal of                         tumor(s), polyp(s), or other lesion(s) by snare                          technique Diagnosis Code(s):     --- Professional ---                        Z86.010, Personal history of colonic polyps                        D12.3, Benign neoplasm of transverse colon (hepatic                         flexure or splenic flexure) CPT copyright 2022 American Medical Association. All rights reserved. The codes documented in this report are preliminary and upon coder review may  be revised to meet current compliance requirements. Lucilla Lame MD, MD 03/30/2023 10:08:00 AM This report has been signed electronically. Number of Addenda: 0 Note Initiated On: 03/30/2023 9:39 AM Scope Withdrawal Time: 0 hours 9 minutes 27 seconds  Total Procedure Duration: 0 hours 13 minutes 15 seconds  Estimated Blood Loss:  Estimated blood loss: none.      [facility]

## 2023-03-30 NOTE — Anesthesia Preprocedure Evaluation (Signed)
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 on medications  Rhythm:Regular Rate:Normal     Neuro/Psych negative neurological ROS  negative psych ROS   GI/Hepatic Neg liver ROS,GERD  Controlled,,  Endo/Other  negative endocrine ROS    Renal/GU negative Renal ROS     Musculoskeletal  (+) Arthritis , Osteoarthritis,    Abdominal  (+) + obese Abdomen: soft. Bowel sounds: normal.  Peds  Hematology negative hematology ROS (+)   Anesthesia Other Findings   Reproductive/Obstetrics                             Anesthesia Physical Anesthesia Plan  ASA: 2  Anesthesia Plan: General   Post-op Pain Management:    Induction:   PONV Risk Score and Plan:   Airway Management Planned: Nasal Cannula  Additional Equipment:   Intra-op Plan:   Post-operative Plan:   Informed Consent: I have reviewed the patients History and Physical, chart, labs and discussed the procedure including the risks, benefits and alternatives for the proposed anesthesia with the patient or authorized representative who has indicated his/her understanding and acceptance.       Plan Discussed with: CRNA  Anesthesia Plan Comments:        Anesthesia Quick Evaluation

## 2023-03-30 NOTE — H&P (Signed)
Lucilla Lame, MD [facility] [address], [place], [place] [address] [phone] Fax : [phone]  Primary Care Physician:  Theresia Lo, NP Primary Gastroenterologist:  Dr. Allen Norris  Pre-Procedure History & Physical: HPI:  Nathaniel Roberts is a 62 y.o. male is here for an colonoscopy.   Past Medical History:  Diagnosis Date   Arthritis    joints, ankles   Asthma    GERD (gastroesophageal reflux disease)    Hyperlipidemia    Hypertension     Past Surgical History:  Procedure Laterality Date   COLONOSCOPY WITH PROPOFOL N/A 02/19/2018   Procedure: COLONOSCOPY WITH PROPOFOL;  Surgeon: Lucilla Lame, MD;  Location: [facility];  Service: Endoscopy;  Laterality: N/A;   CYST REMOVAL LEG Left    INCISION AND DRAINAGE ABSCESS     JOINT REPLACEMENT     KNEE GANGLION EXCISION Right    POLYPECTOMY  02/19/2018   Procedure: POLYPECTOMY;  Surgeon: Lucilla Lame, MD;  Location: [facility];  Service: Endoscopy;;   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 Authorizing Provider  amLODipine (NORVASC) 5 MG tablet Take 1 tablet (5 mg total) by mouth daily. 01/08/23  Yes Theresia Lo, NP  calcium carbonate (TUMS - DOSED IN MG ELEMENTAL CALCIUM) 500 MG chewable tablet Chew 1-2 tablets by mouth at bedtime as needed for indigestion or heartburn.   Yes [provider]  clobetasol ointment (TEMOVATE) AB-123456789 % Apply 1 Application topically daily as needed (psoriasis). 03/12/23  Yes Theresia Lo, NP  Cyanocobalamin (B-12 PO) Take 1 tablet by mouth daily.   Yes [provider]  fenofibrate (TRICOR) 145 MG tablet Take 1 tablet (145 mg total) by mouth daily. 01/08/23  Yes Theresia Lo, NP  losartan-hydrochlorothiazide (HYZAAR)  100-12.5 MG tablet Take 1 tablet by mouth daily. 01/08/23  Yes Theresia Lo, NP  Multiple Vitamin (MULTIVITAMIN) tablet Take 1 tablet by mouth daily.   Yes [provider]  rosuvastatin (CRESTOR) 10 MG tablet Take 1 tablet (10 mg total) by mouth daily. 01/08/23  Yes Theresia Lo, NP  EPINEPHrine (PRIMATENE MIST) 0.125 MG/ACT AERO Inhale 2 puffs into the lungs daily as needed (allergies).    [provider]    Allergies as of 01/08/2023   (No Known Allergies)    History reviewed. No pertinent family history.  Social History   Socioeconomic History   Marital status: Single    Spouse name: Not on file   Number of children: Not on file   Years of education: Not on file   Highest education level: Not on file  Occupational History   Not on file  Tobacco Use   Smoking status: Never   Smokeless tobacco: Never  Vaping Use   Vaping Use: Never used  Substance and Sexual Activity   Alcohol use: Yes    Alcohol/week: 12.0 standard drinks of alcohol    Types: 12 Glasses of wine per week    Comment: occas.   Drug use: Never   Sexual activity: Not on file  Other Topics Concern   Not on file  Social History Narrative   Not on file   Social Determinants of Health   Financial Resource Strain: Not on file  Food Insecurity: Not on file  Transportation Needs: Not on file  Physical  Activity: Not on file  Stress: Not on file  Social Connections: Not on file  Intimate Partner Violence: Not on file    Review of Systems: See HPI, otherwise negative ROS  Physical Exam: BP 134/81   Pulse 67   Temp 98 F (36.7 C) (Temporal)   Resp 14   Ht 5\' 6"  (1.676 m)   Wt 93.8 kg   SpO2 96%   BMI 33.39 kg/m  General:   Alert,  pleasant and cooperative in NAD Head:  Normocephalic and atraumatic. Neck:  Supple; no masses or thyromegaly. Lungs:  Clear throughout to auscultation.    Heart:  Regular rate and rhythm. Abdomen:  Soft, nontender and nondistended. Normal  bowel sounds, without guarding, and without rebound.   Neurologic:  Alert and  oriented x4;  grossly normal neurologically.  Impression/Plan: Nathaniel Roberts is here for an colonoscopy to be performed for a history of adenomatous polyps on 2019   Risks, benefits, limitations, and alternatives regarding  colonoscopy have been reviewed with the patient.  Questions have been answered.  All parties agreeable.   Lucilla Lame, MD  03/30/2023, 8:47 AM

## 2023-03-30 NOTE — Anesthesia Postprocedure Evaluation (Signed)
Anesthesia Post Note  Patient: Nathaniel Roberts  Procedure(s) Performed: COLONOSCOPY WITH PROPOFOL (Rectum)  Patient location during evaluation: Phase II Anesthesia Type: General Level of consciousness: awake and alert Pain management: satisfactory to patient Vital Signs Assessment: post-procedure vital signs reviewed and stable Respiratory status: spontaneous breathing Cardiovascular status: stable Anesthetic complications: no   No notable events documented.   Last Vitals:  Vitals:   03/30/23 1015 03/30/23 1017  BP: 112/73   Pulse: 63 61  Resp: 15 13  Temp:    SpO2: 95% 95%    Last Pain:  Vitals:   03/30/23 1015  TempSrc:   PainSc: 0-No pain                 Kimiyah Blick C Asheley Hellberg

## 2023-03-30 NOTE — Transfer of Care (Signed)
Immediate Anesthesia Transfer of Care Note  Patient: Nathaniel Roberts  Procedure(s) Performed: COLONOSCOPY WITH PROPOFOL (Rectum)  Patient Location: PACU  Anesthesia Type: General  Level of Consciousness: awake, alert  and patient cooperative  Airway and Oxygen Therapy: Patient Spontanous Breathing and Patient connected to supplemental oxygen  Post-op Assessment: Post-op Vital signs reviewed, Patient's Cardiovascular Status Stable, Respiratory Function Stable, Patent Airway and No signs of Nausea or vomiting  Post-op Vital Signs: Reviewed and stable  Complications: No notable events documented.

## 2023-03-31 ENCOUNTER — Encounter: Payer: Self-pay | Admitting: Gastroenterology

## 2023-04-01 LAB — SURGICAL PATHOLOGY

## 2023-04-02 ENCOUNTER — Encounter: Payer: Self-pay | Admitting: Gastroenterology

## 2023-04-06 DIAGNOSIS — M13871 Other specified arthritis, right ankle and foot: Secondary | ICD-10-CM | POA: Diagnosis not present

## 2023-04-06 DIAGNOSIS — M12571 Traumatic arthropathy, right ankle and foot: Secondary | ICD-10-CM | POA: Diagnosis not present

## 2023-04-18 DIAGNOSIS — Z0000 Encounter for general adult medical examination without abnormal findings: Secondary | ICD-10-CM

## 2023-04-18 DIAGNOSIS — Z789 Other specified health status: Secondary | ICD-10-CM | POA: Diagnosis not present

## 2023-05-11 DIAGNOSIS — L821 Other seborrheic keratosis: Secondary | ICD-10-CM | POA: Diagnosis not present

## 2023-05-11 DIAGNOSIS — L918 Other hypertrophic disorders of the skin: Secondary | ICD-10-CM | POA: Diagnosis not present

## 2023-05-28 DIAGNOSIS — Z833 Family history of diabetes mellitus: Secondary | ICD-10-CM | POA: Diagnosis not present

## 2023-05-28 DIAGNOSIS — Z6833 Body mass index (BMI) 33.0-33.9, adult: Secondary | ICD-10-CM | POA: Diagnosis not present

## 2023-05-28 DIAGNOSIS — I10 Essential (primary) hypertension: Secondary | ICD-10-CM | POA: Diagnosis not present

## 2023-05-28 DIAGNOSIS — Z8249 Family history of ischemic heart disease and other diseases of the circulatory system: Secondary | ICD-10-CM | POA: Diagnosis not present

## 2023-05-28 DIAGNOSIS — E785 Hyperlipidemia, unspecified: Secondary | ICD-10-CM | POA: Diagnosis not present

## 2023-05-28 DIAGNOSIS — E669 Obesity, unspecified: Secondary | ICD-10-CM | POA: Diagnosis not present

## 2023-05-28 DIAGNOSIS — Z008 Encounter for other general examination: Secondary | ICD-10-CM | POA: Diagnosis not present

## 2023-05-28 DIAGNOSIS — Z87891 Personal history of nicotine dependence: Secondary | ICD-10-CM | POA: Diagnosis not present

## 2023-05-28 DIAGNOSIS — J45909 Unspecified asthma, uncomplicated: Secondary | ICD-10-CM | POA: Diagnosis not present

## 2023-05-28 DIAGNOSIS — Z809 Family history of malignant neoplasm, unspecified: Secondary | ICD-10-CM | POA: Diagnosis not present

## 2023-05-28 DIAGNOSIS — Z96649 Presence of unspecified artificial hip joint: Secondary | ICD-10-CM | POA: Diagnosis not present

## 2023-05-28 DIAGNOSIS — Z825 Family history of asthma and other chronic lower respiratory diseases: Secondary | ICD-10-CM | POA: Diagnosis not present

## 2023-05-28 DIAGNOSIS — M1990 Unspecified osteoarthritis, unspecified site: Secondary | ICD-10-CM | POA: Diagnosis not present

## 2023-09-15 ENCOUNTER — Encounter: Payer: Self-pay | Admitting: Nurse Practitioner

## 2023-09-15 ENCOUNTER — Ambulatory Visit: Payer: 59 | Admitting: Nurse Practitioner

## 2023-09-15 VITALS — BP 122/76 | HR 76 | Temp 98.1°F | Ht 66.0 in | Wt 209.0 lb

## 2023-09-15 DIAGNOSIS — J4520 Mild intermittent asthma, uncomplicated: Secondary | ICD-10-CM

## 2023-09-15 DIAGNOSIS — I10 Essential (primary) hypertension: Secondary | ICD-10-CM

## 2023-09-15 DIAGNOSIS — E785 Hyperlipidemia, unspecified: Secondary | ICD-10-CM | POA: Diagnosis not present

## 2023-09-15 NOTE — Progress Notes (Signed)
Established Patient Office Visit  Subjective:  Patient ID: Nathaniel Roberts, male    DOB: 1961/10/24  Age: 62 y.o. MRN: [id number]  CC:  Chief Complaint  Patient presents with   Medical Management of Chronic Issues    HPI  Nathaniel Roberts presents for chronic disease management.  He has history of hypertension, hyperlipidemia, arthritis, and asthma.  Previous LDL 114 and HDL 38.80 on 03/09/23. He is on fenofibrate and Crestor 10 mg. Tolerating well.   Asthma: He is using over-the-counter primatene.   He has intermittent constipation and use metamucil.  HPI   Past Medical History:  Diagnosis Date   Arthritis    joints, ankles   Asthma    GERD (gastroesophageal reflux disease)    Hyperlipidemia    Hypertension     Past Surgical History:  Procedure Laterality Date   COLONOSCOPY WITH PROPOFOL N/A 02/19/2018   Procedure: COLONOSCOPY WITH PROPOFOL;  Surgeon: Midge Minium, MD;  Location: [facility];  Service: Endoscopy;  Laterality: N/A;   COLONOSCOPY WITH PROPOFOL N/A 03/30/2023   Procedure: COLONOSCOPY WITH PROPOFOL;  Surgeon: Midge Minium, MD;  Location: [facility];  Service: Endoscopy;  Laterality: N/A;   CYST REMOVAL LEG Left    INCISION AND DRAINAGE ABSCESS     JOINT REPLACEMENT     KNEE GANGLION EXCISION Right    POLYPECTOMY  02/19/2018   Procedure: POLYPECTOMY;  Surgeon: Midge Minium, MD;  Location: [facility];  Service: Endoscopy;;   TOTAL HIP ARTHROPLASTY Right 06/13/2020   Procedure: TOTAL HIP ARTHROPLASTY ANTERIOR APPROACH;  Surgeon: Samson Frederic, MD;  Location: [facility];  Service: Orthopedics;  Laterality: Right;    History reviewed. No pertinent family history.  Social History   Socioeconomic History   Marital status: Single    Spouse name: Not on file   Number of children: Not on file   Years of education: Not on file   Highest education level: Not on file  Occupational History   Not on file  Tobacco Use   Smoking status: Never    Smokeless tobacco: Never  Vaping Use   Vaping status: Never Used  Substance and Sexual Activity   Alcohol use: Yes    Alcohol/week: 12.0 standard drinks of alcohol    Types: 12 Glasses of wine per week    Comment: occas.   Drug use: Never   Sexual activity: Not on file  Other Topics Concern   Not on file  Social History Narrative   Not on file   Social Determinants of Health   Financial Resource Strain: Not on file  Food Insecurity: Not on file  Transportation Needs: Not on file  Physical Activity: Not on file  Stress: Not on file  Social Connections: Unknown (05/13/2022)   Received from [facility], [facility]   Social Network    Social Network: Not on file  Intimate Partner Violence: Unknown (04/04/2022)   Received from [facility], [facility]   HITS    Physically Hurt: Not on file    Insult or Talk Down To: Not on file    Threaten Physical Harm: Not on file    Scream or Curse: Not on file     Outpatient Medications Prior to Visit  Medication Sig Dispense Refill   amLODipine (NORVASC) 5 MG tablet Take 1 tablet (5 mg total) by mouth daily. 90 tablet 4   calcium carbonate (TUMS - DOSED IN MG ELEMENTAL CALCIUM) 500 MG chewable tablet Chew 1-2 tablets  by mouth at bedtime as needed for indigestion or heartburn.     clobetasol ointment (TEMOVATE) 0.05 % Apply 1 Application topically daily as needed (psoriasis). 30 g 4   Cyanocobalamin (B-12 PO) Take 1 tablet by mouth daily.     EPINEPHrine (PRIMATENE MIST) 0.125 MG/ACT AERO Inhale 2 puffs into the lungs daily as needed (allergies).     fenofibrate (TRICOR) 145 MG tablet Take 1 tablet (145 mg total) by mouth daily. 90 tablet 3   losartan-hydrochlorothiazide (HYZAAR) 100-12.5 MG tablet Take 1 tablet by mouth daily. 90 tablet 3   Multiple Vitamin (MULTIVITAMIN) tablet Take 1 tablet by mouth daily.     rosuvastatin (CRESTOR) 10 MG tablet Take 1 tablet (10 mg total) by mouth daily. 90 tablet 3   No facility-administered  medications prior to visit.    No Known Allergies  ROS Review of Systems Negative unless indicated in HPI.    Objective:    Physical Exam Constitutional:      Appearance: Normal appearance.  HENT:     Right Ear: Tympanic membrane normal.     Left Ear: Tympanic membrane normal.     Nose: Nose normal.     Mouth/Throat:     Mouth: Mucous membranes are moist.  Eyes:     Conjunctiva/sclera: Conjunctivae normal.     Pupils: Pupils are equal, round, and reactive to light.  Cardiovascular:     Rate and Rhythm: Normal rate and regular rhythm.     Pulses: Normal pulses.     Heart sounds: Normal heart sounds.  Pulmonary:     Effort: Pulmonary effort is normal.     Breath sounds: Normal breath sounds.  Abdominal:     General: Bowel sounds are normal.     Palpations: Abdomen is soft.  Musculoskeletal:     Cervical back: Normal range of motion. No tenderness.  Skin:    General: Skin is warm.     Findings: No bruising.  Neurological:     General: No focal deficit present.     Mental Status: He is alert and oriented to person, place, and time. Mental status is at baseline.  Psychiatric:        Mood and Affect: Mood normal.        Behavior: Behavior normal.        Thought Content: Thought content normal.        Judgment: Judgment normal.     BP 122/76   Pulse 76   Temp 98.1 F (36.7 C)   Ht 5\' 6"  (1.676 m)   Wt 209 lb (94.8 kg)   SpO2 97%   BMI 33.73 kg/m  Wt Readings from Last 3 Encounters:  09/15/23 209 lb (94.8 kg)  03/30/23 206 lb 14.4 oz (93.8 kg)  03/12/23 209 lb 3.2 oz (94.9 kg)     Health Maintenance  Topic Date Due   HIV Screening  Never done   Hepatitis C Screening  Never done   Zoster Vaccines- Shingrix (1 of 2) 03/14/2011   COVID-19 Vaccine (3 - 2023-24 season) 10/01/2023 (Originally 08/30/2023)   INFLUENZA VACCINE  03/28/2024 (Originally 07/30/2023)   Colonoscopy  03/29/2028   DTaP/Tdap/Td (2 - Td or Tdap) 09/12/2031   HPV VACCINES  Aged Out     There are no preventive care reminders to display for this patient.  Lab Results  Component Value Date   TSH 2.47 03/09/2023   Lab Results  Component Value Date   WBC 5.2 03/09/2023   HGB  15.2 03/09/2023   HCT 44.5 03/09/2023   MCV 94.0 03/09/2023   PLT 208.0 03/09/2023   Lab Results  Component Value Date   NA 137 03/09/2023   K 4.0 03/09/2023   CO2 25 03/09/2023   GLUCOSE 102 (H) 03/09/2023   BUN 29 (H) 03/09/2023   CREATININE 1.01 03/09/2023   BILITOT 0.4 03/09/2023   ALKPHOS 37 (L) 03/09/2023   AST 15 03/09/2023   ALT 24 03/09/2023   PROT 6.7 03/09/2023   ALBUMIN 4.1 03/09/2023   CALCIUM 9.6 03/09/2023   ANIONGAP 9 06/05/2020   EGFR 80 02/24/2022   GFR 80.00 03/09/2023   Lab Results  Component Value Date   CHOL 182 03/09/2023   Lab Results  Component Value Date   HDL 38.80 (L) 03/09/2023   Lab Results  Component Value Date   LDLCALC 114 (H) 03/09/2023   Lab Results  Component Value Date   TRIG 147.0 03/09/2023   Lab Results  Component Value Date   CHOLHDL 5 03/09/2023   No results found for: "HGBA1C"    Assessment & Plan:  Mild intermittent asthma, unspecified whether complicated Assessment & Plan: Stable with OTC inhaler Primatene.      Essential hypertension Assessment & Plan: Patient BP  Vitals:   09/15/23 1257  BP: 122/76    in the office today. Advised pt to follow a low sodium and heart healthy diet. Continue losartan/HCTZ and amlodipine daily.    Orders: -     Comprehensive metabolic panel; Future  Dyslipidemia Assessment & Plan: Lab Results  Component Value Date   CHOL 182 03/09/2023   HDL 38.80 (L) 03/09/2023   LDLCALC 114 (H) 03/09/2023   TRIG 147.0 03/09/2023   CHOLHDL 5 03/09/2023  Continue fenofibrate and rosuvastatin for cardiovascular risk reduction.    Orders: -     Lipid panel; Future    Follow-up: Return in about 6 months (around 03/14/2024) for follow up with fasting lab 2 days prior.    Kara Dies, NP

## 2023-09-20 DIAGNOSIS — E785 Hyperlipidemia, unspecified: Secondary | ICD-10-CM | POA: Insufficient documentation

## 2023-09-20 NOTE — Assessment & Plan Note (Signed)
Lab Results  Component Value Date   CHOL 182 03/09/2023   HDL 38.80 (L) 03/09/2023   LDLCALC 114 (H) 03/09/2023   TRIG 147.0 03/09/2023   CHOLHDL 5 03/09/2023  Continue fenofibrate and rosuvastatin for cardiovascular risk reduction.

## 2023-09-20 NOTE — Assessment & Plan Note (Signed)
Stable with OTC inhaler Primatene.

## 2023-09-20 NOTE — Assessment & Plan Note (Signed)
Patient BP  Vitals:   09/15/23 1257  BP: 122/76    in the office today. Advised pt to follow a low sodium and heart healthy diet. Continue losartan/HCTZ and amlodipine daily.

## 2024-02-19 ENCOUNTER — Other Ambulatory Visit: Payer: Self-pay | Admitting: Nurse Practitioner

## 2024-02-19 DIAGNOSIS — I10 Essential (primary) hypertension: Secondary | ICD-10-CM

## 2024-03-10 ENCOUNTER — Other Ambulatory Visit (INDEPENDENT_AMBULATORY_CARE_PROVIDER_SITE_OTHER): Payer: 59

## 2024-03-10 DIAGNOSIS — I10 Essential (primary) hypertension: Secondary | ICD-10-CM | POA: Diagnosis not present

## 2024-03-10 DIAGNOSIS — E785 Hyperlipidemia, unspecified: Secondary | ICD-10-CM

## 2024-03-10 LAB — LIPID PANEL
Cholesterol: 155 mg/dL (ref 0–200)
HDL: 37 mg/dL — ABNORMAL LOW (ref >39.00)
LDL Cholesterol: 93 mg/dL (ref 0–99)
NonHDL: 118.26
Total CHOL/HDL Ratio: 4
Triglycerides: 126 mg/dL (ref 0.0–149.0)
VLDL: 25.2 mg/dL (ref 0.0–40.0)

## 2024-03-10 LAB — COMPREHENSIVE METABOLIC PANEL
ALT: 27 U/L (ref 0–53)
AST: 17 U/L (ref 0–37)
Albumin: 4.4 g/dL (ref 3.5–5.2)
Alkaline Phosphatase: 39 U/L (ref 39–117)
BUN: 18 mg/dL (ref 6–23)
CO2: 28 meq/L (ref 19–32)
Calcium: 9.4 mg/dL (ref 8.4–10.5)
Chloride: 101 meq/L (ref 96–112)
Creatinine, Ser: 1.1 mg/dL (ref 0.40–1.50)
GFR: 71.7 mL/min (ref >60.00)
Glucose, Bld: 99 mg/dL (ref 70–99)
Potassium: 4.3 meq/L (ref 3.5–5.1)
Sodium: 137 meq/L (ref 135–145)
Total Bilirubin: 0.6 mg/dL (ref 0.2–1.2)
Total Protein: 6.6 g/dL (ref 6.0–8.3)

## 2024-03-15 ENCOUNTER — Encounter: Payer: Self-pay | Admitting: Nurse Practitioner

## 2024-03-15 ENCOUNTER — Ambulatory Visit: Payer: 59 | Admitting: Nurse Practitioner

## 2024-03-15 VITALS — BP 116/70 | HR 67 | Temp 98.2°F | Ht 66.0 in | Wt 208.4 lb

## 2024-03-15 DIAGNOSIS — J4520 Mild intermittent asthma, uncomplicated: Secondary | ICD-10-CM

## 2024-03-15 DIAGNOSIS — Z8042 Family history of malignant neoplasm of prostate: Secondary | ICD-10-CM | POA: Diagnosis not present

## 2024-03-15 DIAGNOSIS — E782 Mixed hyperlipidemia: Secondary | ICD-10-CM

## 2024-03-15 DIAGNOSIS — R7989 Other specified abnormal findings of blood chemistry: Secondary | ICD-10-CM

## 2024-03-15 DIAGNOSIS — R5383 Other fatigue: Secondary | ICD-10-CM

## 2024-03-15 NOTE — Progress Notes (Signed)
 Established Patient Office Visit  Subjective:  Patient ID: Nathaniel Roberts, male    DOB: 30-Mar-1961  Age: 63 y.o. MRN: [id number]  CC:  Chief Complaint  Patient presents with   Medical Management of Chronic Issues   Discussed the use of a AI scribe software for clinical note transcription with the patient, who gave verbal consent to proceed.  HPI  Nathaniel Roberts presents for chronic disease follow-up.  He has history of asthma, hypertension, hyperlipidemia, arthritis, GERD.  He experiences occasional knee pain, which has improved significantly after a cortisone injection, but continues to have significant ankle pain, attributed to past backpacking activities. He has a history of a total hip replacement in 2021 and takes a multivitamin that he believes contains calcium and vitamin D. He occasionally uses CBD for arthritis pain relief and has a history of smoking marijuana occasionally for pain relief.  His cholesterol levels have improved since starting statin therapy.  He is concerned about prostate health due to a family history of prostate cancer  He has decreased energy levels . He has difficulty falling asleep and takes an over-the-counter sleep aid similar to Tylenol PM. Melatonin made him jittery. HPI   Past Medical History:  Diagnosis Date   Arthritis    joints, ankles   Asthma    GERD (gastroesophageal reflux disease)    Hyperlipidemia    Hypertension     Past Surgical History:  Procedure Laterality Date   COLONOSCOPY WITH PROPOFOL N/A 02/19/2018   Procedure: COLONOSCOPY WITH PROPOFOL;  Surgeon: Midge Minium, MD;  Location: [facility];  Service: Endoscopy;  Laterality: N/A;   COLONOSCOPY WITH PROPOFOL N/A 03/30/2023   Procedure: COLONOSCOPY WITH PROPOFOL;  Surgeon: Midge Minium, MD;  Location: [facility];  Service: Endoscopy;  Laterality: N/A;   CYST REMOVAL LEG Left    INCISION AND DRAINAGE ABSCESS     JOINT REPLACEMENT     KNEE GANGLION EXCISION Right     POLYPECTOMY  02/19/2018   Procedure: POLYPECTOMY;  Surgeon: Midge Minium, MD;  Location: [facility];  Service: Endoscopy;;   TOTAL HIP ARTHROPLASTY Right 06/13/2020   Procedure: TOTAL HIP ARTHROPLASTY ANTERIOR APPROACH;  Surgeon: Samson Frederic, MD;  Location: [facility];  Service: Orthopedics;  Laterality: Right;    History reviewed. No pertinent family history.  Social History   Socioeconomic History   Marital status: Single    Spouse name: Not on file   Number of children: Not on file   Years of education: Not on file   Highest education level: Not on file  Occupational History   Not on file  Tobacco Use   Smoking status: Never   Smokeless tobacco: Never  Vaping Use   Vaping status: Never Used  Substance and Sexual Activity   Alcohol use: Yes    Alcohol/week: 12.0 standard drinks of alcohol    Types: 12 Glasses of wine per week    Comment: occas.   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   Social Drivers of Corporate investment banker Strain: Not on file  Food Insecurity: Not on file  Transportation Needs: Not on file  Physical Activity: Not on file  Stress: Not on file  Social Connections: Unknown (05/13/2022)   Received from [facility]   Social Network    Social Network: Not on file  Intimate  Partner Violence: Unknown (04/04/2022)   Received from [facility], [facility]   HITS    Physically Hurt: Not on file    Insult or Talk Down To: Not on file    Threaten Physical Harm: Not on file    Scream or Curse: Not on file     Outpatient Medications Prior to Visit  Medication Sig Dispense Refill   amLODipine (NORVASC) 5 MG tablet TAKE 1 TABLET BY MOUTH DAILY 90 tablet 4   calcium carbonate (TUMS - DOSED IN MG ELEMENTAL CALCIUM) 500 MG chewable tablet Chew 1-2 tablets by mouth at bedtime as needed for indigestion or heartburn.      clobetasol ointment (TEMOVATE) 0.05 % Apply 1 Application topically daily as needed (psoriasis). 30 g 4   Cyanocobalamin (B-12 PO) Take 1 tablet by mouth daily.     EPINEPHrine (PRIMATENE MIST) 0.125 MG/ACT AERO Inhale 2 puffs into the lungs daily as needed (allergies).     fenofibrate (TRICOR) 145 MG tablet TAKE 1 TABLET BY MOUTH DAILY 90 tablet 3   losartan-hydrochlorothiazide (HYZAAR) 100-12.5 MG tablet TAKE 1 TABLET BY MOUTH DAILY 90 tablet 3   Multiple Vitamin (MULTIVITAMIN) tablet Take 1 tablet by mouth daily.     rosuvastatin (CRESTOR) 10 MG tablet TAKE 1 TABLET BY MOUTH DAILY 90 tablet 3   No facility-administered medications prior to visit.    No Known Allergies  ROS Review of Systems Negative unless indicated in HPI.    Objective:    Physical Exam Constitutional:      Appearance: Normal appearance.  HENT:     Mouth/Throat:     Mouth: Mucous membranes are moist.  Eyes:     Conjunctiva/sclera: Conjunctivae normal.     Pupils: Pupils are equal, round, and reactive to light.  Cardiovascular:     Rate and Rhythm: Normal rate and regular rhythm.     Pulses: Normal pulses.     Heart sounds: Normal heart sounds.  Pulmonary:     Effort: Pulmonary effort is normal.     Breath sounds: Normal breath sounds.  Abdominal:     General: Bowel sounds are normal.     Palpations: Abdomen is soft.  Musculoskeletal:     Cervical back: Normal range of motion. No tenderness.  Skin:    General: Skin is warm.     Findings: No bruising.  Neurological:     General: No focal deficit present.     Mental Status: He is alert and oriented to person, place, and time. Mental status is at baseline.  Psychiatric:        Mood and Affect: Mood normal.        Behavior: Behavior normal.        Thought Content: Thought content normal.        Judgment: Judgment normal.     BP 116/70   Pulse 67   Temp 98.2 F (36.8 C)   Ht 5\' 6"  (1.676 m)   Wt 208 lb 6.4 oz (94.5 kg)   SpO2 97%   BMI  33.64 kg/m  Wt Readings from Last 3 Encounters:  03/15/24 208 lb 6.4 oz (94.5 kg)  09/15/23 209 lb (94.8 kg)  03/30/23 206 lb 14.4 oz (93.8 kg)     Health Maintenance  Topic Date Due   Pneumococcal Vaccine 21-29 Years old (1 of 2 - PCV) Never done   HIV Screening  Never done   Hepatitis C Screening  Never done   Zoster Vaccines- Shingrix (2  of 2) 03/05/2023   INFLUENZA VACCINE  03/28/2024 (Originally 07/30/2023)   COVID-19 Vaccine (3 - 2024-25 season) 03/30/2024 (Originally 08/30/2023)   Colonoscopy  03/29/2028   DTaP/Tdap/Td (2 - Td or Tdap) 09/12/2031   HPV VACCINES  Aged Out    There are no preventive care reminders to display for this patient.  Lab Results  Component Value Date   TSH 1.65 03/15/2024   Lab Results  Component Value Date   WBC 5.2 03/09/2023   HGB 15.2 03/09/2023   HCT 44.5 03/09/2023   MCV 94.0 03/09/2023   PLT 208.0 03/09/2023   Lab Results  Component Value Date   NA 137 03/10/2024   K 4.3 03/10/2024   CO2 28 03/10/2024   GLUCOSE 99 03/10/2024   BUN 18 03/10/2024   CREATININE 1.10 03/10/2024   BILITOT 0.6 03/10/2024   ALKPHOS 39 03/10/2024   AST 17 03/10/2024   ALT 27 03/10/2024   PROT 6.6 03/10/2024   ALBUMIN 4.4 03/10/2024   CALCIUM 9.4 03/10/2024   ANIONGAP 9 06/05/2020   EGFR 80 02/24/2022   GFR 71.70 03/10/2024   Lab Results  Component Value Date   CHOL 155 03/10/2024   Lab Results  Component Value Date   HDL 37.00 (L) 03/10/2024   Lab Results  Component Value Date   LDLCALC 93 03/10/2024   Lab Results  Component Value Date   TRIG 126.0 03/10/2024   Lab Results  Component Value Date   CHOLHDL 4 03/10/2024   No results found for: "HGBA1C"    Assessment & Plan:  Mixed hyperlipidemia Assessment & Plan: Lab Results  Component Value Date   CHOL 155 03/10/2024   HDL 37.00 (L) 03/10/2024   LDLCALC 93 03/10/2024   TRIG 126.0 03/10/2024   CHOLHDL 4 03/10/2024   Continue statin therapy and fenofibrate for  cardiovascular risk reduction. Encourage patient to consume heart healthy and balanced diet.    Mild intermittent asthma without complication Assessment & Plan: Stable at present.   Other fatigue -     Testosterone -     TSH -     VITAMIN D 25 Hydroxy (Vit-D Deficiency, Fractures) -     B12 and Folate Panel  Family history of prostate cancer Assessment & Plan: Family history of prostate cancer. PSA and testosterone levels to be checked.  Lab Results  Component Value Date   PSA 0.56 03/15/2024   PSA 0.52 03/09/2023   PSA 0.45 02/24/2022      Orders: -     PSA  Low testosterone Assessment & Plan:  Lab Results  Component Value Date   TESTOSTERONE 282.56 (L) 03/15/2024  -Referral placed to Urology.  Orders: -     Ambulatory referral to Urology    Follow-up: Return in about 6 months (around 09/15/2024) for chronic management.   Kara Dies, NP

## 2024-03-15 NOTE — Patient Instructions (Signed)
 Please go to the lab for blood work

## 2024-03-16 LAB — B12 AND FOLATE PANEL
Folate: >25.2 ng/mL (ref >5.9)
Vitamin B-12: 281 pg/mL (ref 211–911)

## 2024-03-16 LAB — PSA: PSA: 0.56 ng/mL (ref 0.10–4.00)

## 2024-03-16 LAB — TSH: TSH: 1.65 u[IU]/mL (ref 0.35–5.50)

## 2024-03-16 LAB — TESTOSTERONE: Testosterone: 282.56 ng/dL — ABNORMAL LOW (ref 300.00–890.00)

## 2024-03-16 LAB — VITAMIN D 25 HYDROXY (VIT D DEFICIENCY, FRACTURES): VITD: 28.34 ng/mL — ABNORMAL LOW (ref 30.00–100.00)

## 2024-03-17 ENCOUNTER — Telehealth: Payer: Self-pay

## 2024-03-17 NOTE — Progress Notes (Signed)
 Please inform pt: PSA, thyroid and Vit b12 and folate normal. Vit D is low take vit D supplement 2000 IU daily. Testosterone is low. Will refer to urology as discussed during the visit.

## 2024-03-17 NOTE — Telephone Encounter (Signed)
 Copied from CRM [id number]. Topic: Clinical - Lab/Test Results >> Mar 17, 2024  2:55 PM Isabell A wrote: Reason for CRM: Patient calling back - relayed message for results, no additional questions. Patient states he will wait for the urology office to contact him for scheduling.

## 2024-03-24 DIAGNOSIS — Z8042 Family history of malignant neoplasm of prostate: Secondary | ICD-10-CM | POA: Insufficient documentation

## 2024-03-24 DIAGNOSIS — R7989 Other specified abnormal findings of blood chemistry: Secondary | ICD-10-CM | POA: Insufficient documentation

## 2024-03-24 NOTE — Assessment & Plan Note (Signed)
 Family history of prostate cancer. PSA and testosterone levels to be checked.  Lab Results  Component Value Date   PSA 0.56 03/15/2024   PSA 0.52 03/09/2023   PSA 0.45 02/24/2022

## 2024-03-24 NOTE — Assessment & Plan Note (Signed)
 Stable at present.

## 2024-03-24 NOTE — Assessment & Plan Note (Signed)
  Lab Results  Component Value Date   TESTOSTERONE 282.56 (L) 03/15/2024  -Referral placed to Urology.

## 2024-03-24 NOTE — Assessment & Plan Note (Signed)
 Lab Results  Component Value Date   CHOL 155 03/10/2024   HDL 37.00 (L) 03/10/2024   LDLCALC 93 03/10/2024   TRIG 126.0 03/10/2024   CHOLHDL 4 03/10/2024   Continue statin therapy and fenofibrate for cardiovascular risk reduction. Encourage patient to consume heart healthy and balanced diet.

## 2024-04-20 ENCOUNTER — Ambulatory Visit: Admitting: Urology

## 2024-04-20 VITALS — BP 153/77 | HR 72 | Ht 67.0 in | Wt 202.4 lb

## 2024-04-20 DIAGNOSIS — R7989 Other specified abnormal findings of blood chemistry: Secondary | ICD-10-CM | POA: Diagnosis not present

## 2024-04-20 DIAGNOSIS — Z125 Encounter for screening for malignant neoplasm of prostate: Secondary | ICD-10-CM

## 2024-04-20 NOTE — Progress Notes (Signed)
 04/20/24 2:08 PM   Nathaniel Roberts [birth year] [id number]  CC: Low testosterone , nocturia, PSA screening  HPI: 63 year old male with obesity and BMI of 32 who reported afternoon fatigue to his PCP and had a testosterone  checked that was borderline low at 280.  Vitamin D  level was also low.  He denies any problems with erections.  He has nocturia 2-5 times at night.  Has never been tested for sleep apnea.  Lives alone so unsure if he snores.  He feels like these symptoms started after he was started on blood pressure medications.  He denies any gross hematuria.  PSA was normal from March 2025 at 0.56 and has been stable over the last 3 years.  He is averse to considering medications for testosterone .   PMH: Past Medical History:  Diagnosis Date   Arthritis    joints, ankles   Asthma    GERD (gastroesophageal reflux disease)    Hyperlipidemia    Hypertension     Surgical History: Past Surgical History:  Procedure Laterality Date   COLONOSCOPY WITH PROPOFOL  N/A 02/19/2018   Procedure: COLONOSCOPY WITH PROPOFOL ;  Surgeon: Marnee Sink, MD;  Location: [facility];  Service: Endoscopy;  Laterality: N/A;   COLONOSCOPY WITH PROPOFOL  N/A 03/30/2023   Procedure: COLONOSCOPY WITH PROPOFOL ;  Surgeon: Marnee Sink, MD;  Location: [facility];  Service: Endoscopy;  Laterality: N/A;   CYST REMOVAL LEG Left    INCISION AND DRAINAGE ABSCESS     JOINT REPLACEMENT     KNEE GANGLION EXCISION Right    POLYPECTOMY  02/19/2018   Procedure: POLYPECTOMY;  Surgeon: Marnee Sink, MD;  Location: [facility];  Service: Endoscopy;;   TOTAL HIP ARTHROPLASTY Right 06/13/2020   Procedure: TOTAL HIP ARTHROPLASTY ANTERIOR APPROACH;  Surgeon: Adonica Hoose, MD;  Location: [facility];  Service: Orthopedics;  Laterality: Right;     Social History:  reports that he has never smoked. He has never used smokeless tobacco. He reports current alcohol  use of about 12.0 standard drinks of alcohol  per  week. He reports that he does not use drugs.  Physical Exam: BP (!) 153/77 (BP Location: Left Arm, Patient Position: Sitting, Cuff Size: Normal)   Pulse 72   Ht 5\' 7"  (1.702 m)   Wt 202 lb 6.4 oz (91.8 kg)   SpO2 98%   BMI 31.70 kg/m    Constitutional:  Alert and oriented, No acute distress. Cardiovascular: No clubbing, cyanosis, or edema. Respiratory: Normal respiratory effort, no increased work of breathing. GI: Abdomen is soft, nontender, nondistended, no abdominal masses   Laboratory Data: Reviewed, see HPI  Assessment & Plan:   62 year old male with single low testosterone  level of 280 and afternoon fatigue.  We reviewed the AUA guidelines regarding evaluation and management of patients with low testosterone , and that a second testosterone  level below 300 as needed to consider testosterone  replacement.  He is averse to considering medications at this time.  We reviewed behavioral strategies including weight loss, exercise, adequate sleep, considering being tested for sleep apnea.  I offered to repeat testosterone  level but he deferred.  We discussed other options like off-label Clomid.  He would like to start with behavioral strategies and follow-up as needed.  Reassurance was provided regarding very low and normal PSA values and stability over the last few years.  Can continue screening every 1-2 years with PCP  Follow-up with urology as needed  Jay Meth, MD 04/20/2024  [facility] [address], [address]  [place], [place] [address] [phone]

## 2024-04-20 NOTE — Patient Instructions (Addendum)

## 2024-08-02 DIAGNOSIS — M1712 Unilateral primary osteoarthritis, left knee: Secondary | ICD-10-CM | POA: Diagnosis not present

## 2024-09-19 NOTE — Progress Notes (Signed)
 Established Patient Office Visit  Subjective:  Patient ID: Nathaniel Roberts, male    DOB: [birth year]  Age: 63 y.o. MRN: [id number]  CC:  Chief Complaint  Patient presents with   Medical Management of Chronic Issues   Discussed the use of a AI scribe software for clinical note transcription with the patient, who gave verbal consent to proceed.  HPI  Nathaniel Roberts is a 63 year old male with hypertension, asthma, and psoriasis who presents for medication refills and follow-up on his chronic conditions.  His blood pressure is well-controlled at 118/72 mmHg on, losartan , and amlodipine .  He is complaint with rosuvastatin  and fenofibrate  for hyperlipidemia.   Asthma is managed with an over-the-counter primatene inhaler. He experiences ragweed allergies and takes allergy pills, mindful of his effect on blood pressure.  Psoriasis affects his feet, flaring with irritation. Clobetasol  is used for flares, resolving symptoms in three to four days. A knee steroid injection also improved psoriasis temporarily.  He has knee issues, including a torn meniscus, causing swelling and pain, sometimes requiring steroid injections. Recent injection at [facility] did not require drainage.  He has seen by urology in 4/23/due to low testosterone  level but would not like to get started on testosterone  supplement.   HPI   Past Medical History:  Diagnosis Date   Arthritis    joints, ankles   Asthma    GERD (gastroesophageal reflux disease)    Hyperlipidemia    Hypertension     Past Surgical History:  Procedure Laterality Date   COLONOSCOPY WITH PROPOFOL  N/A 02/19/2018   Procedure: COLONOSCOPY WITH PROPOFOL ;  Surgeon: Jinny Carmine, MD;  Location: [facility];  Service: Endoscopy;  Laterality: N/A;   COLONOSCOPY WITH PROPOFOL  N/A 03/30/2023   Procedure: COLONOSCOPY WITH PROPOFOL ;  Surgeon: Jinny Carmine, MD;  Location: [facility];  Service: Endoscopy;  Laterality: N/A;   CYST REMOVAL LEG  Left    INCISION AND DRAINAGE ABSCESS     JOINT REPLACEMENT     KNEE GANGLION EXCISION Right    POLYPECTOMY  02/19/2018   Procedure: POLYPECTOMY;  Surgeon: Jinny Carmine, MD;  Location: [facility];  Service: Endoscopy;;   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 No pertinent family history.  Social History   Socioeconomic History   Marital status: Single    Spouse name: Not on file   Number of children: Not on file   Years of education: Not on file   Highest education level: Not on file  Occupational History   Not on file  Tobacco Use   Smoking status: Never   Smokeless tobacco: Never  Vaping Use   Vaping status: Never Used  Substance and Sexual Activity   Alcohol  use: Yes    Alcohol /week: 12.0 standard drinks of alcohol     Types: 12 Glasses of wine per week    Comment: occas.   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   Social Drivers of Corporate investment banker Strain: Not on file  Food Insecurity: Not on file  Transportation Needs: Not on file  Physical Activity: Not on file  Stress: Not on file  Social Connections: Unknown (05/13/2022)   Received from [facility]  Social Network    Social Network: Not on file  Intimate Partner Violence: Unknown (04/04/2022)   Received from [facility]   HITS    Physically Hurt: Not on file    Insult or Talk Down To: Not on file    Threaten Physical Harm: Not on file    Scream or Curse: Not on file     Outpatient Medications Prior to Visit  Medication Sig Dispense Refill   calcium  carbonate (TUMS - DOSED IN MG ELEMENTAL CALCIUM ) 500 MG chewable tablet Chew 1-2 tablets by mouth at bedtime as needed for indigestion or heartburn.     Cyanocobalamin   (B-12 PO) Take 1 tablet by mouth daily.     EPINEPHrine (PRIMATENE MIST) 0.125 MG/ACT AERO Inhale 2 puffs into the lungs daily as needed (allergies).     Multiple Vitamin (MULTIVITAMIN) tablet Take 1 tablet by mouth daily.     amLODipine  (NORVASC ) 5 MG tablet TAKE 1 TABLET BY MOUTH DAILY 90 tablet 4   clobetasol  ointment (TEMOVATE ) 0.05 % Apply 1 Application topically daily as needed (psoriasis). 30 g 4   fenofibrate  (TRICOR ) 145 MG tablet TAKE 1 TABLET BY MOUTH DAILY 90 tablet 3   losartan -hydrochlorothiazide (HYZAAR) 100-12.5 MG tablet TAKE 1 TABLET BY MOUTH DAILY 90 tablet 3   rosuvastatin  (CRESTOR ) 10 MG tablet TAKE 1 TABLET BY MOUTH DAILY 90 tablet 3   No facility-administered medications prior to visit.    No Known Allergies  ROS Review of Systems Negative unless indicated in HPI.    Objective:    Physical Exam  BP 118/72   Pulse 79   Temp 98.1 F (36.7 C)   Ht 5' 7 (1.702 m)   Wt 208 lb 3.2 oz (94.4 kg)   SpO2 97%   BMI 32.61 kg/m  Wt Readings from Last 3 Encounters:  09/20/24 208 lb 3.2 oz (94.4 kg)  04/20/24 202 lb 6.4 oz (91.8 kg)  03/15/24 208 lb 6.4 oz (94.5 kg)     Health Maintenance  Topic Date Due   HIV Screening  Never done   Hepatitis C Screening  Never done   Pneumococcal Vaccine: 50+ Years (1 of 2 - PCV) Never done   Zoster Vaccines- Shingrix (2 of 2) 03/05/2023   COVID-19 Vaccine (3 - 2025-26 season) 10/05/2024 (Originally 08/29/2024)   Influenza Vaccine  03/28/2025 (Originally 07/29/2024)   Colonoscopy  03/29/2028   DTaP/Tdap/Td (2 - Td or Tdap) 09/12/2031   Hepatitis B Vaccines 19-59 Average Risk  Aged Out   HPV VACCINES  Aged Out   Meningococcal B Vaccine  Aged Out    There are no preventive care reminders to display for this patient.  Lab Results  Component Value Date   TSH 1.65 03/15/2024   Lab Results  Component Value Date   WBC 5.2 03/09/2023   HGB 15.2 03/09/2023   HCT 44.5 03/09/2023   MCV 94.0 03/09/2023   PLT 208.0  03/09/2023   Lab Results  Component Value Date   NA 137 03/10/2024   K 4.3 03/10/2024   CO2 28 03/10/2024   GLUCOSE 99 03/10/2024   BUN 18 03/10/2024   CREATININE 1.10 03/10/2024   BILITOT 0.6 03/10/2024   ALKPHOS 39 03/10/2024   AST 17 03/10/2024   ALT 27 03/10/2024   PROT 6.6 03/10/2024   ALBUMIN 4.4 03/10/2024   CALCIUM  9.4 03/10/2024   ANIONGAP 9 06/05/2020   EGFR 80 02/24/2022   GFR 71.70 03/10/2024   Lab Results  Component Value  Date   CHOL 155 03/10/2024   Lab Results  Component Value Date   HDL 37.00 (L) 03/10/2024   Lab Results  Component Value Date   LDLCALC 93 03/10/2024   Lab Results  Component Value Date   TRIG 126.0 03/10/2024   Lab Results  Component Value Date   CHOLHDL 4 03/10/2024   No results found for: HGBA1C    Assessment & Plan:  Mixed hyperlipidemia Assessment & Plan: Lab Results  Component Value Date   CHOL 155 03/10/2024   HDL 37.00 (L) 03/10/2024   LDLCALC 93 03/10/2024   TRIG 126.0 03/10/2024   CHOLHDL 4 03/10/2024   -Continue statin therapy and fenofibrate  for cardiovascular risk reduction. -Encourage patient to consume heart healthy and balanced diet. - Will repeat at next visit.    Mild intermittent asthma, unspecified whether complicated Assessment & Plan: Uses OTC primate as needed. - States nor able to afford albuterol inhaler.   Essential hypertension Assessment & Plan: Hypertension well-controlled with losartan  and amlodipine .  - Continue losartan  hydrochlorothiazide 100- 12.5 mg and amlodipine  5 mg  daily.  Orders: -     amLODIPine  Besylate; Take 1 tablet (5 mg total) by mouth daily.  Dispense: 90 tablet; Refill: 4  Other orders -     Clobetasol  Propionate; Apply 1 Application topically daily as needed (psoriasis).  Dispense: 30 g; Refill: 4 -     Fenofibrate ; Take 1 tablet (145 mg total) by mouth daily.  Dispense: 90 tablet; Refill: 4 -     Losartan  Potassium-HCTZ; Take 1 tablet by mouth daily.   Dispense: 90 tablet; Refill: 4 -     Rosuvastatin  Calcium ; Take 1 tablet (10 mg total) by mouth daily.  Dispense: 90 tablet; Refill: 4    Follow-up: Return in about 6 months (around 03/20/2025) for physical, follow up with fasting lab 2 days prior.   Merit Maybee, NP

## 2024-09-20 ENCOUNTER — Encounter: Payer: Self-pay | Admitting: Nurse Practitioner

## 2024-09-20 ENCOUNTER — Ambulatory Visit: Admitting: Nurse Practitioner

## 2024-09-20 VITALS — BP 118/72 | HR 79 | Temp 98.1°F | Ht 67.0 in | Wt 208.2 lb

## 2024-09-20 DIAGNOSIS — I10 Essential (primary) hypertension: Secondary | ICD-10-CM

## 2024-09-20 DIAGNOSIS — J4520 Mild intermittent asthma, uncomplicated: Secondary | ICD-10-CM | POA: Diagnosis not present

## 2024-09-20 DIAGNOSIS — K5730 Diverticulosis of large intestine without perforation or abscess without bleeding: Secondary | ICD-10-CM | POA: Insufficient documentation

## 2024-09-20 DIAGNOSIS — E782 Mixed hyperlipidemia: Secondary | ICD-10-CM | POA: Diagnosis not present

## 2024-09-20 MED ORDER — CLOBETASOL PROPIONATE 0.05 % EX OINT: 1.0000 | TOPICAL_OINTMENT | Freq: Every day | CUTANEOUS | 4 refills | Status: AC | PRN

## 2024-09-20 MED ORDER — AMLODIPINE BESYLATE 5 MG PO TABS: 5.0000 mg | ORAL_TABLET | Freq: Every day | ORAL | 4 refills | Status: AC

## 2024-09-20 MED ORDER — FENOFIBRATE 145 MG PO TABS: 145.0000 mg | ORAL_TABLET | Freq: Every day | ORAL | 4 refills | Status: AC

## 2024-09-20 MED ORDER — LOSARTAN POTASSIUM-HCTZ 100-12.5 MG PO TABS: 1.0000 | ORAL_TABLET | Freq: Every day | ORAL | 4 refills | Status: AC

## 2024-09-20 MED ORDER — ROSUVASTATIN CALCIUM 10 MG PO TABS: 10.0000 mg | ORAL_TABLET | Freq: Every day | ORAL | 4 refills | Status: AC

## 2024-09-27 NOTE — Assessment & Plan Note (Signed)
 Lab Results  Component Value Date   CHOL 155 03/10/2024   HDL 37.00 (L) 03/10/2024   LDLCALC 93 03/10/2024   TRIG 126.0 03/10/2024   CHOLHDL 4 03/10/2024   -Continue statin therapy and fenofibrate  for cardiovascular risk reduction. -Encourage patient to consume heart healthy and balanced diet. - Will repeat at next visit.

## 2024-09-27 NOTE — Assessment & Plan Note (Signed)
 Hypertension well-controlled with losartan  and amlodipine .  - Continue losartan  hydrochlorothiazide 100- 12.5 mg and amlodipine  5 mg  daily.

## 2024-09-27 NOTE — Assessment & Plan Note (Signed)
 Uses OTC primate as needed. - States nor able to afford albuterol inhaler.

## 2025-03-21 ENCOUNTER — Other Ambulatory Visit

## 2025-03-23 ENCOUNTER — Encounter: Admitting: Nurse Practitioner
